# Patient Record
Sex: Male | Born: 1970 | Hispanic: No | Marital: Married | Smoking: Former smoker
Health system: Southern US, Community
[De-identification: ages and names within clinical notes are randomized; demographics above are authoritative.]

## PROBLEM LIST (undated history)

## (undated) DIAGNOSIS — M25569 Pain in unspecified knee: Secondary | ICD-10-CM

## (undated) DIAGNOSIS — R7303 Prediabetes: Secondary | ICD-10-CM

## (undated) DIAGNOSIS — I10 Essential (primary) hypertension: Secondary | ICD-10-CM

## (undated) DIAGNOSIS — E785 Hyperlipidemia, unspecified: Secondary | ICD-10-CM

## (undated) HISTORY — PX: HEMORROIDECTOMY: SUR656

## (undated) HISTORY — PX: LEG SURGERY: SHX1003

## (undated) HISTORY — DX: Hyperlipidemia, unspecified: E78.5

---

## 1998-08-01 ENCOUNTER — Emergency Department (HOSPITAL_COMMUNITY): Admission: EM | Admit: 1998-08-01 | Discharge: 1998-08-01 | Payer: Self-pay | Admitting: Internal Medicine

## 2001-04-07 ENCOUNTER — Emergency Department (HOSPITAL_COMMUNITY): Admission: EM | Admit: 2001-04-07 | Discharge: 2001-04-07 | Payer: Self-pay

## 2010-02-24 ENCOUNTER — Emergency Department (HOSPITAL_COMMUNITY): Admission: EM | Admit: 2010-02-24 | Discharge: 2010-02-24 | Payer: Self-pay | Admitting: Family Medicine

## 2013-03-14 ENCOUNTER — Ambulatory Visit: Payer: 59

## 2013-03-14 ENCOUNTER — Ambulatory Visit (INDEPENDENT_AMBULATORY_CARE_PROVIDER_SITE_OTHER): Payer: 59 | Admitting: Emergency Medicine

## 2013-03-14 VITALS — BP 132/88 | HR 70 | Temp 97.8°F | Resp 16 | Ht 66.0 in | Wt 211.0 lb

## 2013-03-14 DIAGNOSIS — M25511 Pain in right shoulder: Secondary | ICD-10-CM

## 2013-03-14 DIAGNOSIS — L0231 Cutaneous abscess of buttock: Secondary | ICD-10-CM

## 2013-03-14 DIAGNOSIS — E669 Obesity, unspecified: Secondary | ICD-10-CM

## 2013-03-14 DIAGNOSIS — L03317 Cellulitis of buttock: Secondary | ICD-10-CM

## 2013-03-14 DIAGNOSIS — M25519 Pain in unspecified shoulder: Secondary | ICD-10-CM

## 2013-03-14 DIAGNOSIS — K649 Unspecified hemorrhoids: Secondary | ICD-10-CM

## 2013-03-14 DIAGNOSIS — K602 Anal fissure, unspecified: Secondary | ICD-10-CM

## 2013-03-14 LAB — GLUCOSE, POCT (MANUAL RESULT ENTRY): POC Glucose: 91 mg/dl (ref 70–99)

## 2013-03-14 MED ORDER — LIDOCAINE (ANORECTAL) 5 % EX GEL: 1.0000 [drp] | Freq: Three times a day (TID) | CUTANEOUS | Status: DC

## 2013-03-14 MED ORDER — DILTIAZEM GEL 2 %: 1.0000 "application " | Freq: Three times a day (TID) | CUTANEOUS | Status: DC

## 2013-03-14 NOTE — Progress Notes (Signed)
  Subjective:    Patient ID: Michael [name], male    DOB: 1971-01-04, 42 y.o.   MRN: [id number]  HPI 42 year old male presents with:  1. Right shoulder pain x on and off for over a year; NKI; he works as a Scientist, forensic so constantly lifting heavy objects; at gym 1 month ago working out at [employer] and felt a lot of pain in right shoulder; No pain in neck, pain with internal and external rotation  2. hemorrhoids x 6 years; has seen 2 doctors concerning hemorrhoids.  Has not seen surgeon to remove.  Would like to see surgeon.  Intense pain 2 days ago; could not sit; wife saw 2 hemorrhoids.  Bleeding. Review of Systems     Objective:   Physical Exam patient has pain and limitation to internal rotation. He has normal rotator cuff strength. He has a popping sensation when he rotates the right shoulder. Examination of the anal area reveals ulcerated area on the inside of the right buttocks. There is inflammation around the anal area. When I do a rectal exam there is exquisite tenderness at 6:00. This would be consistent with a rectal fissure    [facility] reading (PRIMARY) by  Dr Cleta Alberts x-rays normal  Results for orders placed in visit on 03/14/13  GLUCOSE, POCT (MANUAL RESULT ENTRY)      Result Value Range   POC Glucose 91  70 - 99 mg/dl      Assessment & Plan:  Patient has a physical exam and history consistent with a anal fissure. I placed him on Cardizem and lidocaine to see if we can get this to heal. Appointment has been made with Gen. surgery to evaluate the area. He does not want to do any further evaluation of his shoulder at the present time but he will call when he is ready to see an orthopedist.

## 2013-03-18 LAB — HERPES SIMPLEX VIRUS CULTURE: Organism ID, Bacteria: NOT DETECTED

## 2013-03-22 ENCOUNTER — Ambulatory Visit (INDEPENDENT_AMBULATORY_CARE_PROVIDER_SITE_OTHER): Payer: 59 | Admitting: General Surgery

## 2013-03-22 ENCOUNTER — Telehealth (INDEPENDENT_AMBULATORY_CARE_PROVIDER_SITE_OTHER): Payer: Self-pay | Admitting: General Surgery

## 2013-03-22 NOTE — Telephone Encounter (Signed)
LMOM asking patient to return my call if he could come in sooner

## 2013-03-26 ENCOUNTER — Ambulatory Visit (INDEPENDENT_AMBULATORY_CARE_PROVIDER_SITE_OTHER): Payer: 59 | Admitting: Surgery

## 2013-05-21 ENCOUNTER — Encounter (INDEPENDENT_AMBULATORY_CARE_PROVIDER_SITE_OTHER): Payer: Self-pay | Admitting: Surgery

## 2013-05-21 ENCOUNTER — Ambulatory Visit (INDEPENDENT_AMBULATORY_CARE_PROVIDER_SITE_OTHER): Payer: 59 | Admitting: Surgery

## 2013-05-21 VITALS — BP 126/68 | HR 68 | Temp 97.9°F | Resp 14 | Ht 66.0 in | Wt 205.2 lb

## 2013-05-21 DIAGNOSIS — K645 Perianal venous thrombosis: Secondary | ICD-10-CM

## 2013-05-21 NOTE — Progress Notes (Signed)
[name] DOB: 1970/11/03 MRN: [id number]                                                                                      DATE: 05/21/2013  PCP: No PCP Per Patient Referring Provider: No ref. provider found  IMPRESSION:  Thrombosed left lateral external hemorrhoid  PLAN:   Discussed excision, and he is agreeable  Procedure Note: The thrombosed hemorrhoid is anesthetized with 1% xylo+Epi.The overlying skin is excised and the clot extracted. Gauze dresing applied. He wil continue sitz baths and recheck here is a week or two                  CC:  Chief Complaint  Patient presents with  . New Evaluation    eval throm hems    HPI:  Michael [name] is a 42 y.o.  male who presents for evaluation of a thrombose hemorrhoid. He has had issues with "hemorrhoids " for anbout seven years, occasionally protruding and giving him problems with BM's. Yesterday one became very tender and firm and he was started on some xylo creme with help and sent to the urgent office for evaluation  PMH:  has a past medical history of Hyperlipidemia.  PSH:   has no past surgical history on file.  ALLERGIES:  No Known Allergies  MEDICATIONS: Current outpatient prescriptions:ibuprofen (ADVIL,MOTRIN) 200 MG tablet, Take 200 mg by mouth every 6 (six) hours as needed for pain., Disp: , Rfl: ;  lidocaine (XYLOCAINE) 5 % ointment, , Disp: , Rfl: ;  Lidocaine, Anorectal, 5 % GEL, Apply 1 drop topically 3 (three) times daily., Disp: 30 g, Rfl: 2  ROS: He has filled out our 12 point review of systems and it is negative . EXAM:   VS: BP 126/68  Pulse 68  Temp(Src) 97.9 F (36.6 C) (Temporal)  Resp 14  Ht 5\' 6"  (1.676 m)  Wt 205 lb 3.2 oz (93.078 kg)  BMI 33.14 kg/m2 General: Alert, NAD Rectal: Tender thrombosed Left lateral hemorrhoid.      Symphany Fleissner J 05/21/2013  CC: No ref. provider found, No PCP Per Patient

## 2013-05-21 NOTE — Patient Instructions (Signed)
Hemorrhoids Hemorrhoids are swollen veins around the rectum or anus. There are two types of hemorrhoids:   Internal hemorrhoids. These occur in the veins just inside the rectum. They may poke through to the outside and become irritated and painful.  External hemorrhoids. These occur in the veins outside the anus and can be felt as a painful swelling or hard lump near the anus. CAUSES  Pregnancy.   Obesity.   Constipation or diarrhea.   Straining to have a bowel movement.   Sitting for long periods on the toilet.  Heavy lifting or other activity that caused you to strain.  Anal intercourse. SYMPTOMS   Pain.   Anal itching or irritation.   Rectal bleeding.   Fecal leakage.   Anal swelling.   One or more lumps around the anus.  DIAGNOSIS  Your caregiver may be able to diagnose hemorrhoids by visual examination. Other examinations or tests that may be performed include:   Examination of the rectal area with a gloved hand (digital rectal exam).   Examination of anal canal using a small tube (scope).   A blood test if you have lost a significant amount of blood.  A test to look inside the colon (sigmoidoscopy or colonoscopy). TREATMENT Most hemorrhoids can be treated at home. However, if symptoms do not seem to be getting better or if you have a lot of rectal bleeding, your caregiver may perform a procedure to help make the hemorrhoids get smaller or remove them completely. Possible treatments include:   Placing a rubber band at the base of the hemorrhoid to cut off the circulation (rubber band ligation).   Injecting a chemical to shrink the hemorrhoid (sclerotherapy).   Using a tool to burn the hemorrhoid (infrared light therapy).   Surgically removing the hemorrhoid (hemorrhoidectomy).   Stapling the hemorrhoid to block blood flow to the tissue (hemorrhoid stapling).  HOME CARE INSTRUCTIONS   Eat foods with fiber, such as whole grains, beans,  nuts, fruits, and vegetables. Ask your doctor about taking products with added fiber in them (fibersupplements).  Increase fluid intake. Drink enough water and fluids to keep your urine clear or pale yellow.   Exercise regularly.   Go to the bathroom when you have the urge to have a bowel movement. Do not wait.   Avoid straining to have bowel movements.   Keep the anal area dry and clean. Use wet toilet paper or moist towelettes after a bowel movement.   Medicated creams and suppositories may be used or applied as directed.   Only take over-the-counter or prescription medicines as directed by your caregiver.   Take warm sitz baths for 15 20 minutes, 3 4 times a day to ease pain and discomfort.   Place ice packs on the hemorrhoids if they are tender and swollen. Using ice packs between sitz baths may be helpful.   Put ice in a plastic bag.   Place a towel between your skin and the bag.   Leave the ice on for 15 20 minutes, 3 4 times a day.   Do not use a donut-shaped pillow or sit on the toilet for long periods. This increases blood pooling and pain.  SEEK MEDICAL CARE IF:  You have increasing pain and swelling that is not controlled by treatment or medicine.  You have uncontrolled bleeding.  You have difficulty or you are unable to have a bowel movement.  You have pain or inflammation outside the area of the hemorrhoids. MAKE SURE YOU:    Understand these instructions.  Will watch your condition.  Will get help right away if you are not doing well or get worse. Document Released: 09/30/2000 Document Revised: 09/19/2012 Document Reviewed: 08/07/2012 [employer] Patient Information 2014 [employer].  

## 2013-06-05 ENCOUNTER — Ambulatory Visit
Admission: RE | Admit: 2013-06-05 | Discharge: 2013-06-05 | Disposition: A | Discharge status: Home / Self Care | Payer: 59 | Source: Ambulatory Visit | Attending: General Surgery | Admitting: General Surgery

## 2013-06-05 ENCOUNTER — Ambulatory Visit (INDEPENDENT_AMBULATORY_CARE_PROVIDER_SITE_OTHER): Payer: 59 | Admitting: General Surgery

## 2013-06-05 VITALS — BP 118/82 | HR 90 | Temp 100.6°F | Resp 18 | Ht 66.0 in | Wt 209.4 lb

## 2013-06-05 DIAGNOSIS — K6289 Other specified diseases of anus and rectum: Secondary | ICD-10-CM

## 2013-06-05 DIAGNOSIS — R300 Dysuria: Secondary | ICD-10-CM

## 2013-06-05 DIAGNOSIS — R309 Painful micturition, unspecified: Secondary | ICD-10-CM

## 2013-06-05 MED ORDER — IOHEXOL 300 MG/ML  SOLN
125.0000 mL | Freq: Once | INTRAMUSCULAR | Status: AC | PRN
  Administered 2013-06-05: 125 mL via INTRAVENOUS

## 2013-06-05 MED ORDER — SULFAMETHOXAZOLE-TRIMETHOPRIM 800-160 MG PO TABS: 2.0000 | ORAL_TABLET | Freq: Two times a day (BID) | ORAL | Status: DC

## 2013-06-05 NOTE — Progress Notes (Addendum)
Chief Complaint  Patient presents with  . Follow-up    HISTORY: Michael [name] is a 42 y.o. male who presents to the office with anal pain.  He is s/p excision of a thrombosed hemorrhoid 2 weeks ago.  Other symptoms include pain relief with urination.  He has had several episodes like this over the past few years.  He has tried dietary changes in the past with no success.  He denies sharp pain with BM's.     His bowel habits are regular and frequent and his bowel movements are soft.  his fiber intake is dietary.     Past Medical History  Diagnosis Date  . Hyperlipidemia       No past surgical history on file.      Current Outpatient Prescriptions  Medication Sig Dispense Refill  . ibuprofen (ADVIL,MOTRIN) 200 MG tablet Take 200 mg by mouth every 6 (six) hours as needed for pain.      Marland Kitchen lidocaine (XYLOCAINE) 5 % ointment       . Lidocaine, Anorectal, 5 % GEL Apply 1 drop topically 3 (three) times daily.  30 g  2  . sulfamethoxazole-trimethoprim (BACTRIM DS,SEPTRA DS) 800-160 MG per tablet Take 2 tablets by mouth 2 (two) times daily.  28 tablet  2   No current facility-administered medications for this visit.      No Known Allergies    Family History  Problem Relation Age of Onset  . Diabetes Mother   . Diabetes Father     History   Social History  . Marital Status: Married    Spouse Name: N/A    Number of Children: N/A  . Years of Education: N/A   Social History Main Topics  . Smoking status: Former Smoker    Quit date: 09/28/2011  . Smokeless tobacco: Never Used  . Alcohol Use: 12.0 oz/week    20 Cans of beer per week  . Drug Use: No  . Sexual Activity: Not on file   Other Topics Concern  . Not on file   Social History Narrative  . No narrative on file      REVIEW OF SYSTEMS - PERTINENT POSITIVES ONLY: Review of Systems - General ROS: negative for - chills, fever or weight loss Hematological and Lymphatic ROS: negative for - bleeding problems, blood clots  or bruising Respiratory ROS: no cough, shortness of breath, or wheezing Cardiovascular ROS: no chest pain or dyspnea on exertion Gastrointestinal ROS: no abdominal pain, change in bowel habits, or black or bloody stools Genito-Urinary ROS: no dysuria, trouble voiding, or hematuria  EXAM: Filed Vitals:   06/05/13 1452  BP: 118/82  Pulse: 90  Temp: 100.6 F (38.1 C)  Resp: 18    General appearance: alert and cooperative GI: soft, non-tender; bowel sounds normal; no masses,  no organomegaly Anal Exam Findings: thrombectomy site healing well, his pain is associated with a anterior mass, no masses noted on DRE, no posterior fissure, unable to tolerate anoscopy Aspiration of anterior mass attempted.  Verbal consent obtained.  Area prepped and infused with lidocaine.  Aspirated with a larger needle.  No return of purulent fluid noted.      ASSESSMENT AND PLAN: Michael [name] is a 42 y.o. M with anterior perianal pain.  This gets better temporarily with urination.  He is also running a low grade fever.  I suspect an infectious process, but I cannot identify anything concrete on my exam.  I will get a CT to  rule out any deep fluid collections.  I will also get a UA to evaluate for UTI.  I will then treat empirically for prostatitis with bactrim.  I encouraged him to follow up with a PCP for his general care.  I will have him f/u PRN if his tests are negative.  I will be glad to examine his internal hemorrhoids in the future, once this other pain is addressed.     Addendum: CT reviewed with a possible small fluid collection at the base of the penis.  I am going to treat for possible prostatitis.  I have recommended that he see his PCP if the pain does not resolve with a course of bactrim.  UA was neg  Vanita Panda, MD Colon and Rectal Surgery / General Surgery [facility]      Visit Diagnoses: 1. Urination pain   2. Anal pain     Primary Care Physician: No PCP  Per Patient

## 2013-06-05 NOTE — Patient Instructions (Signed)

## 2013-06-06 ENCOUNTER — Telehealth (INDEPENDENT_AMBULATORY_CARE_PROVIDER_SITE_OTHER): Payer: Self-pay

## 2013-06-06 LAB — URINALYSIS, ROUTINE W REFLEX MICROSCOPIC
Hgb urine dipstick: NEGATIVE
Ketones, ur: NEGATIVE mg/dL
Leukocytes, UA: NEGATIVE
Nitrite: NEGATIVE
Urobilinogen, UA: 0.2 mg/dL (ref 0.0–1.0)
pH: 5.5 (ref 5.0–8.0)

## 2013-06-06 NOTE — Telephone Encounter (Signed)
Message copied by Ivory Broad on Thu Jun 06, 2013  9:31 AM ------      Message from: [place], Broadus John.      Created: Thu Jun 06, 2013  8:27 AM       Please let him know that his UA was negative, and his CT showed a possible small fluid collection at the base of his penis.  He should continue complete his antibiotic course.  If he is still having trouble after the antibiotics are complete, he should contact his PCP for possible referral to a urologist.  If he would like me to look at his internal hemorrhoids at some point in the future, I would be glad to do this once his pain is better controlled.                  AT      ----- Message -----         From: Rad Results In Interface         Sent: 06/05/2013   4:55 PM           To: Romie Levee, MD                   ------

## 2013-06-06 NOTE — Telephone Encounter (Signed)
I called and notified the pt of the results below.  I told him to complete the antibiotics.  He states he still has a lot of pain.  I advised to also try soaking in a warm tub for 20 minutes about 3 times a day.  That should help the pain and the antibiotics should help him get better.  I told him if no better after completes them, call his medical md.  I told him he can come back at a later date to have hemorrhoids checked.

## 2013-08-20 ENCOUNTER — Encounter (HOSPITAL_COMMUNITY): Payer: Self-pay | Admitting: Emergency Medicine

## 2013-08-20 ENCOUNTER — Emergency Department (HOSPITAL_COMMUNITY): Payer: 59

## 2013-08-20 ENCOUNTER — Emergency Department (HOSPITAL_COMMUNITY)
Admission: EM | Admit: 2013-08-20 | Discharge: 2013-08-21 | Disposition: A | Discharge status: Home / Self Care | Payer: 59 | Attending: Emergency Medicine | Admitting: Emergency Medicine

## 2013-08-20 DIAGNOSIS — Z862 Personal history of diseases of the blood and blood-forming organs and certain disorders involving the immune mechanism: Secondary | ICD-10-CM | POA: Insufficient documentation

## 2013-08-20 DIAGNOSIS — Z87891 Personal history of nicotine dependence: Secondary | ICD-10-CM | POA: Insufficient documentation

## 2013-08-20 DIAGNOSIS — Z8639 Personal history of other endocrine, nutritional and metabolic disease: Secondary | ICD-10-CM | POA: Insufficient documentation

## 2013-08-20 DIAGNOSIS — K644 Residual hemorrhoidal skin tags: Secondary | ICD-10-CM | POA: Insufficient documentation

## 2013-08-20 LAB — CBC WITH DIFFERENTIAL/PLATELET
Basophils Absolute: 0.1 10*3/uL (ref 0.0–0.1)
Basophils Relative: 1 % (ref 0–1)
HCT: 44.4 % (ref 39.0–52.0)
Lymphocytes Relative: 30 % (ref 12–46)
MCHC: 35.6 g/dL (ref 30.0–36.0)
Monocytes Absolute: 0.6 10*3/uL (ref 0.1–1.0)
Neutro Abs: 4.3 10*3/uL (ref 1.7–7.7)
Neutrophils Relative %: 57 % (ref 43–77)
RDW: 12.2 % (ref 11.5–15.5)
WBC: 7.6 10*3/uL (ref 4.0–10.5)

## 2013-08-20 LAB — BASIC METABOLIC PANEL
Chloride: 104 mEq/L (ref 96–112)
Creatinine, Ser: 0.93 mg/dL (ref 0.50–1.35)
GFR calc Af Amer: >90 mL/min (ref >90)
GFR calc non Af Amer: >90 mL/min (ref >90)
Potassium: 4.9 mEq/L (ref 3.5–5.1)

## 2013-08-20 MED ORDER — MORPHINE SULFATE 4 MG/ML IJ SOLN
4.0000 mg | Freq: Once | INTRAMUSCULAR | Status: AC
  Administered 2013-08-20: 4 mg via INTRAVENOUS
  Filled 2013-08-20: qty 1

## 2013-08-20 MED ORDER — SODIUM CHLORIDE 0.9 % IV BOLUS (SEPSIS)
1000.0000 mL | Freq: Once | INTRAVENOUS | Status: AC
  Administered 2013-08-20: 1000 mL via INTRAVENOUS

## 2013-08-20 MED ORDER — IOHEXOL 300 MG/ML  SOLN
100.0000 mL | Freq: Once | INTRAMUSCULAR | Status: AC | PRN
  Administered 2013-08-20: 100 mL via INTRAVENOUS

## 2013-08-20 NOTE — ED Notes (Signed)
Patient presents today with a chief complaint of rectal pain from current hemorrhoids. Patient is seeking treatment for his hemorrhoids but reports today his rectum is inflamed and the pain has worsened.  Patient also reporting heavy blood when wiping.

## 2013-08-20 NOTE — ED Provider Notes (Addendum)
CSN: [id number]     Arrival date & time 08/20/13  1823 History   First MD Initiated Contact with Patient 08/20/13 2002     Chief Complaint  Patient presents with  . Rectal Pain   (Consider location/radiation/quality/duration/timing/severity/associated sxs/prior Treatment) HPI Comments: 42 y.o. male who presents to the office with anal pain.  He is s/p excision of a thrombosed hemorrhoid in July. Pt states that his current pain has been actually going on for few days, and got worse today. He has been having bloody stools throughout - no mucus or pus appreciated. Pt denies any n/v/f/c. Pt has been applying some topical hemorrhoidal cream with minimal relief at this time.  The history is provided by the patient and medical records.    Past Medical History  Diagnosis Date  . Hyperlipidemia    History reviewed. No pertinent past surgical history. Family History  Problem Relation Age of Onset  . Diabetes Mother   . Diabetes Father    History  Substance Use Topics  . Smoking status: Former Smoker    Quit date: 09/28/2011  . Smokeless tobacco: Never Used  . Alcohol Use: 12.0 oz/week    20 Cans of beer per week    Review of Systems  Constitutional: Negative for activity change and appetite change.  Respiratory: Negative for cough and shortness of breath.   Cardiovascular: Negative for chest pain.  Gastrointestinal: Positive for blood in stool, anal bleeding and rectal pain. Negative for abdominal pain.  Genitourinary: Negative for dysuria.    Allergies  Review of patient's allergies indicates no known allergies.  Home Medications   Current Outpatient Rx  Name  Route  Sig  Dispense  Refill  . ibuprofen (ADVIL,MOTRIN) 200 MG tablet   Oral   Take 200 mg by mouth every 6 (six) hours as needed for pain.          BP 133/82  Pulse 71  Temp(Src) 99 F (37.2 C) (Oral)  Resp 20  SpO2 97% Physical Exam  Nursing note and vitals reviewed. Constitutional: He is oriented to  person, place, and time. He appears well-developed.  HENT:  Head: Normocephalic and atraumatic.  Eyes: Conjunctivae and EOM are normal. Pupils are equal, round, and reactive to light.  Neck: Normal range of motion. Neck supple.  Cardiovascular: Normal rate and regular rhythm.   Pulmonary/Chest: Effort normal and breath sounds normal.  Abdominal: Soft. Bowel sounds are normal. He exhibits no distension. There is no tenderness. There is no rebound and no guarding.  Pt has diffuse perirectal induration and erythema.  No large visible hemorrhoid. Pts DRE does reveals some rectal lesion - ? Fluctuance, extremely tender.  Neurological: He is alert and oriented to person, place, and time.  Skin: Skin is warm.    ED Course  Procedures (including critical care time) Labs Review Labs Reviewed  BASIC METABOLIC PANEL  CBC WITH DIFFERENTIAL   Imaging Review No results found.  EKG Interpretation   None       MDM  No diagnosis found.  Pt with hx of hemorrhoids comes in with rectal pain and bloody stools. Rectal exam was tender. DDX would include proctitis vs. hemorrhoids and it's infections complications - including abscess/fistula.  We will get CT scan of the pelvis, to ensure there is no abscess. Given the hx, likely not proctitis.   Derwood Kaplan, MD 08/20/13 2124  12:49 AM CT pelvis is neg for absces.s Will d.c and have him see the [facility] rectal team.  Derwood Kaplan, MD 08/21/13 [phone]

## 2013-08-21 MED ORDER — OXYCODONE-ACETAMINOPHEN 5-325 MG PO TABS
2.0000 | ORAL_TABLET | Freq: Once | ORAL | Status: AC
  Administered 2013-08-21: 2 via ORAL
  Filled 2013-08-21: qty 2

## 2013-08-21 MED ORDER — OXYCODONE-ACETAMINOPHEN 5-325 MG PO TABS: 1.0000 | ORAL_TABLET | ORAL | Status: DC | PRN

## 2013-08-21 MED ORDER — HYDROCORTISONE 1 % EX CREA: TOPICAL_CREAM | CUTANEOUS | Status: DC

## 2013-08-21 MED ORDER — LIDOCAINE 5 % EX OINT: 1.0000 "application " | TOPICAL_OINTMENT | CUTANEOUS | Status: DC | PRN

## 2013-08-21 MED ORDER — DOCUSATE SODIUM 100 MG PO CAPS: 100.0000 mg | ORAL_CAPSULE | Freq: Two times a day (BID) | ORAL | Status: DC

## 2013-12-11 ENCOUNTER — Other Ambulatory Visit: Payer: Self-pay | Admitting: Specialist

## 2013-12-11 DIAGNOSIS — N50819 Testicular pain, unspecified: Secondary | ICD-10-CM

## 2013-12-12 ENCOUNTER — Ambulatory Visit
Admission: RE | Admit: 2013-12-12 | Discharge: 2013-12-12 | Disposition: A | Discharge status: Home / Self Care | Payer: No Typology Code available for payment source | Source: Ambulatory Visit | Attending: Specialist | Admitting: Specialist

## 2013-12-12 DIAGNOSIS — N50819 Testicular pain, unspecified: Secondary | ICD-10-CM

## 2014-12-02 IMAGING — CT CT PELVIS W/ CM
2 of 3 series · 17 of 46 positions shown, 19 images · IV contrast (omnipaque)
Comparison: CT of the pelvis June 05, 2013.

ADDENDUM:
The left iliac lesion would be better characterized on MRI of the
pelvis with contrast as clinically indicated.

  By: [REDACTED]AL DATA:  Hemorrhoid pain, evaluate for perirectal fistula or
abscess.
EXAM:
CT PELVIS WITH CONTRAST
TECHNIQUE: Multidetector CT imaging of the pelvis was performed using the
standard protocol following the bolus administration of intravenous
contrast.
CONTRAST:  100mL OMNIPAQUE IOHEXOL 300 MG/ML  SOLN

[Series 3: pelvis 5.0 b31f · axial · 0.68mm/px · z∈[+762,+1008]mm · 14 of 57 slices shown, 16 images]
[im 4/57  soft-tissue]
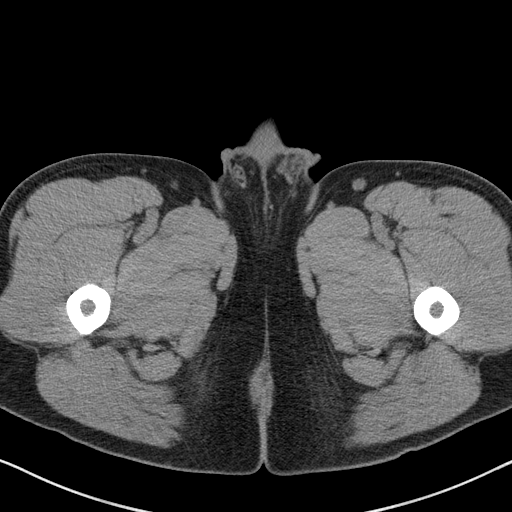
[im 4/57  bone]
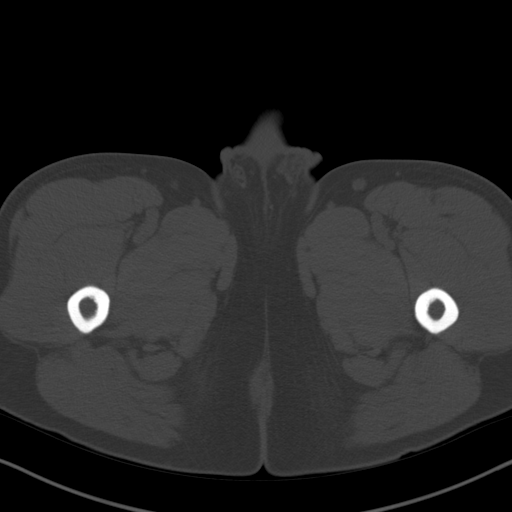
[im 8/57  soft-tissue]
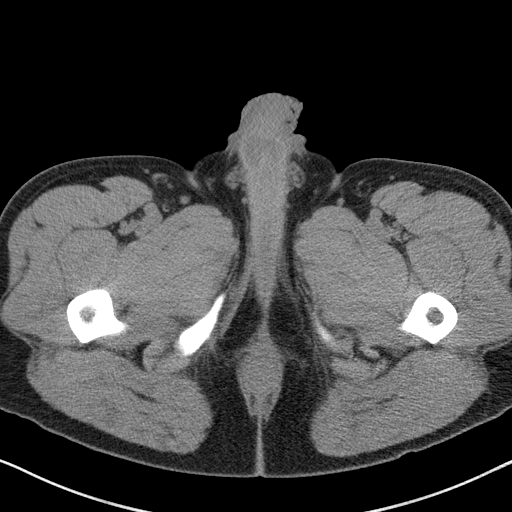
[im 11/57  soft-tissue]
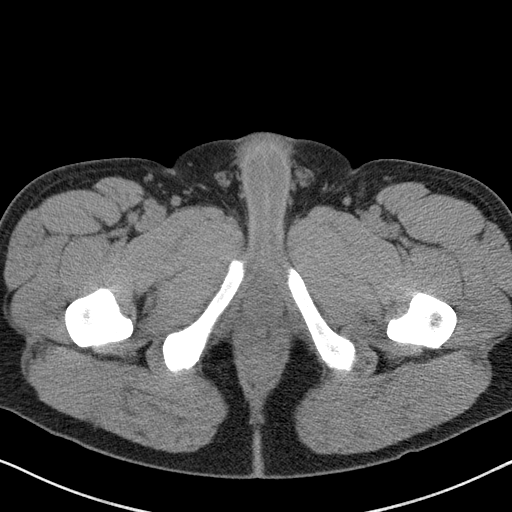
[im 15/57  soft-tissue]
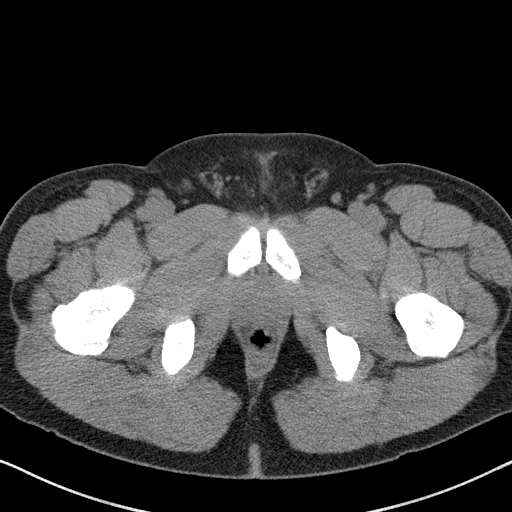
[im 19/57  soft-tissue]
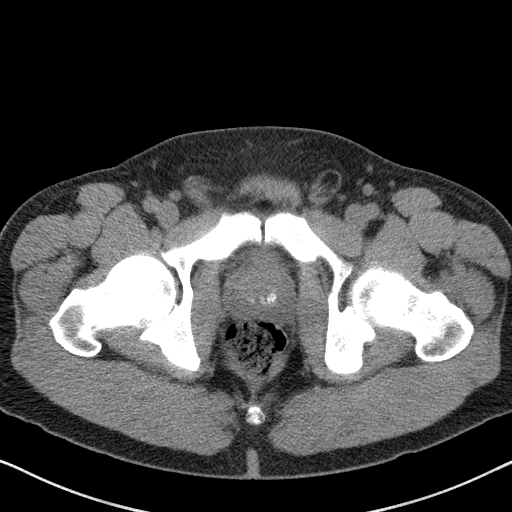
[im 22/57  soft-tissue]
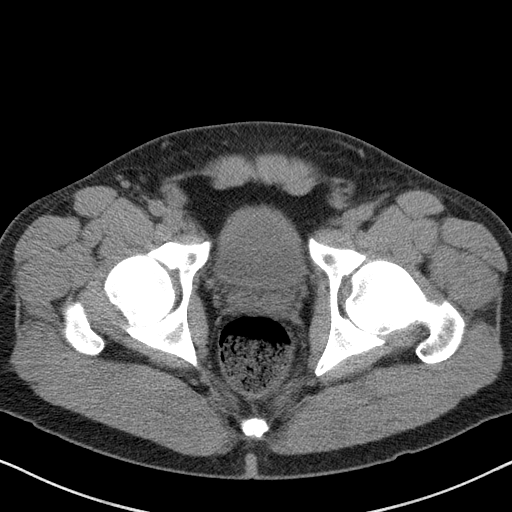
[im 26/57  soft-tissue]
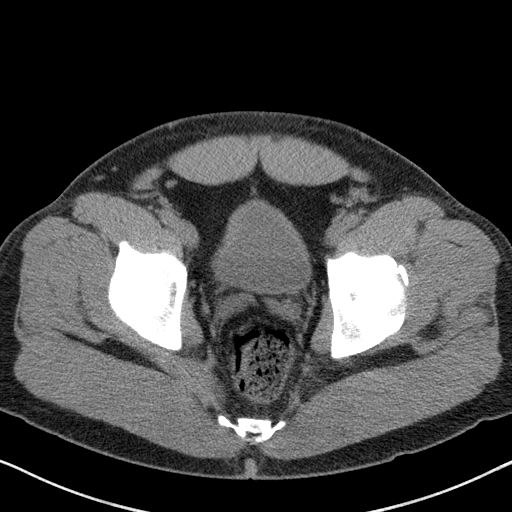
[im 31/57  soft-tissue]
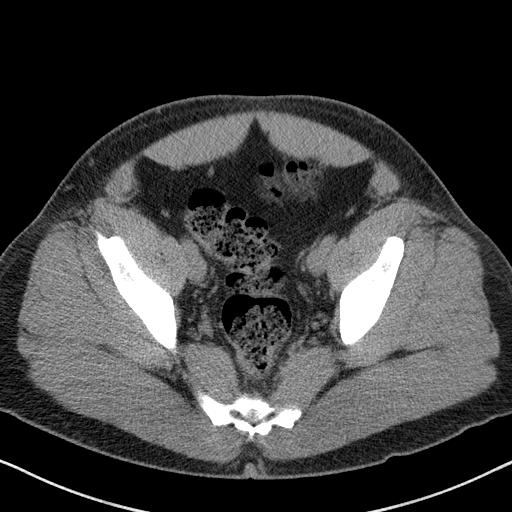
[im 35/57  soft-tissue]
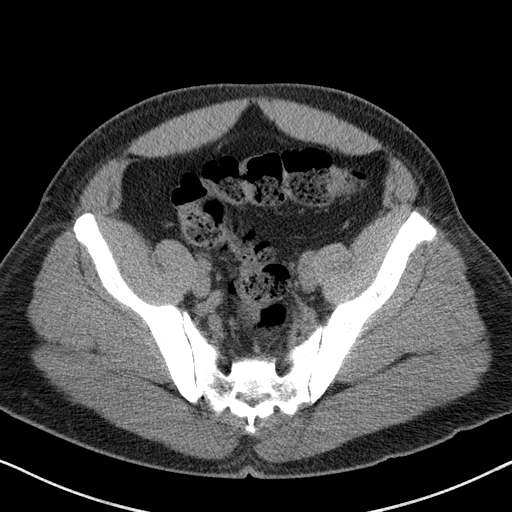
[im 35/57  bone]
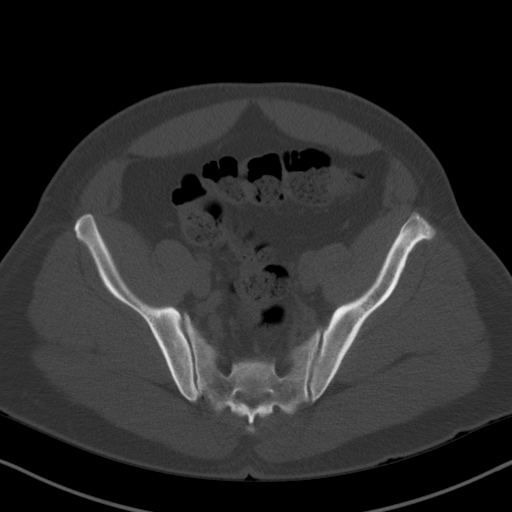
[im 38/57  soft-tissue]
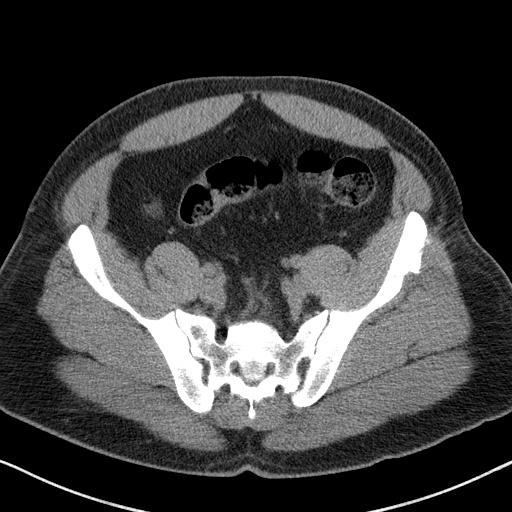
[im 42/57  soft-tissue]
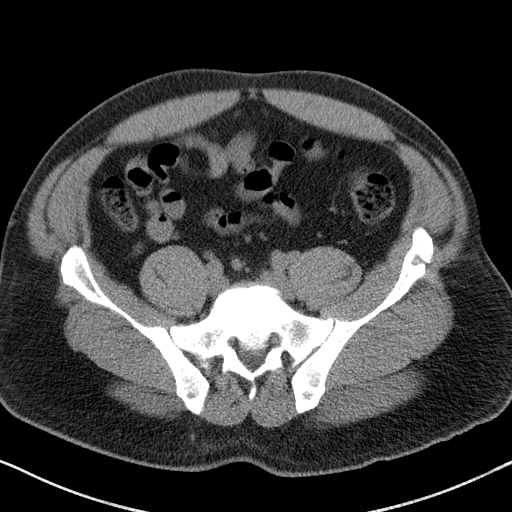
[im 46/57  soft-tissue]
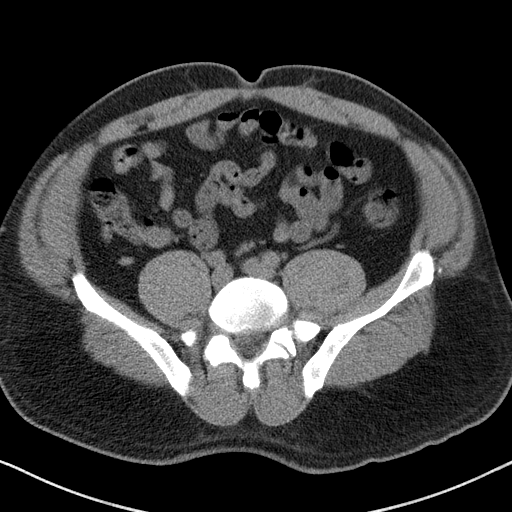
[im 49/57  soft-tissue]
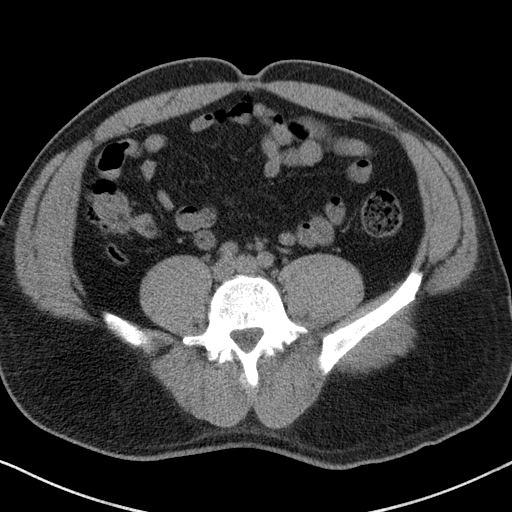
[im 53/57  soft-tissue]
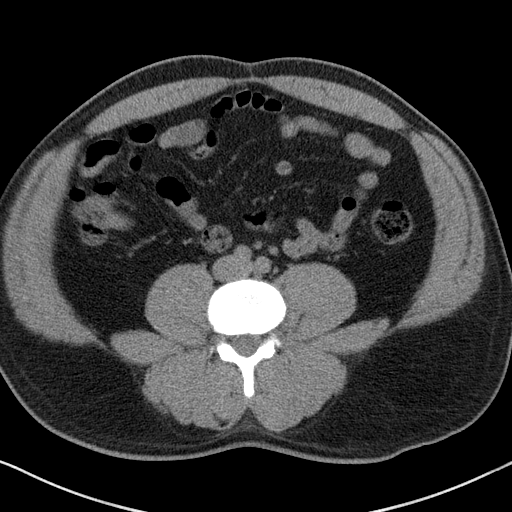

[Series 602: <mpr thick range> · coronal · 0.68mm/px · 3 of 80 slices shown]
[im 27/80  soft-tissue]
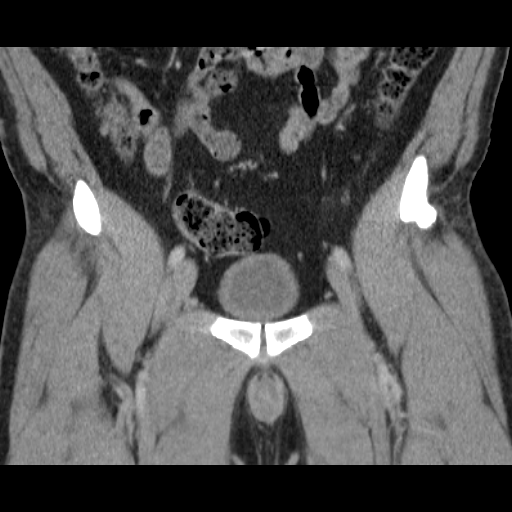
[im 36/80  soft-tissue]
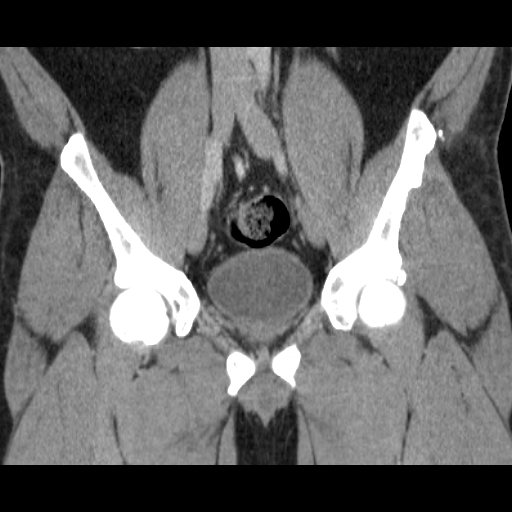
[im 44/80  soft-tissue]
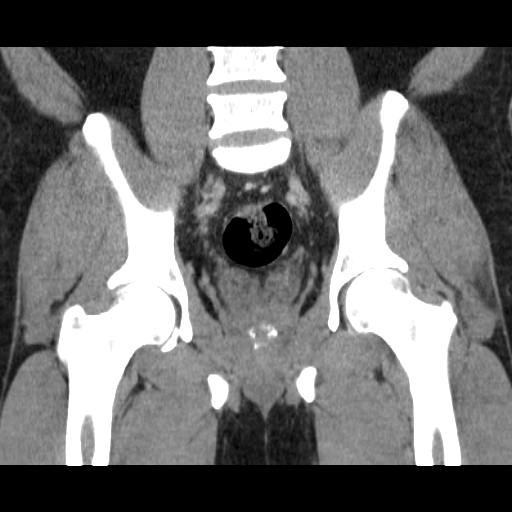

[17 of 46 positions shown; findings below may reference images not displayed]

FINDINGS: Moderate amount of retained large bowel stool in the included view
of the colon. Normal air-filled appendix. No perirectal inflammatory
changes, free fluid or focal fluid collections. Focal low density
seen within the anal region on prior examination is not seen today.
Preservation of the ischiorectal fat.

Great vessels are normal in course and caliber. Urinary bladder is
partially distended and unremarkable. Prostate is 4.8 cm in
transaxial dimension with dystrophic calcifications. Small inguinal
lymph nodes are unchanged. Bony excrescence from the left anterior
inferior iliac spine, with central lucency and possible sequestrum
which may reflect old trauma/infection or post procedural change,
similar in appearance to June 05, 2013.
IMPRESSION: No CT findings of fistula or abscess. Resolution of low-density
possible fluid collection seen on prior CT. If ongoing concern for
fistula, enteric contrast may be of added value.

By: Ranjani Nickerson

## 2015-02-03 ENCOUNTER — Encounter: Payer: Self-pay | Admitting: Physician Assistant

## 2015-02-17 ENCOUNTER — Ambulatory Visit: Payer: Self-pay | Admitting: Physician Assistant

## 2015-03-26 IMAGING — US US SCROTUM
1 series · 13 of 25 positions shown · non-contrast
Comparison: CT PELVIS W/CM dated 08/20/2013; CT PELVIS W/CM dated
06/05/2013

CLINICAL DATA: 0

EXAM:
SCROTAL ULTRASOUND
DOPPLER ULTRASOUND OF THE TESTICLES
TECHNIQUE: Complete ultrasound examination of the testicles, epididymis, and
other scrotal structures was performed. Color and spectral Doppler
ultrasound were also utilized to evaluate blood flow to the
testicles.

[Series 1: us scrotum · 0.10mm/px · 13 of 92 slices shown]
[im 1/92]
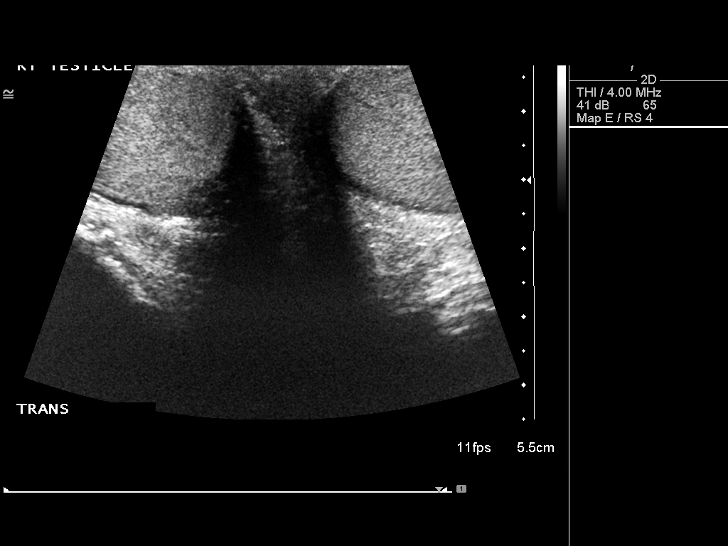
[im 8/92]
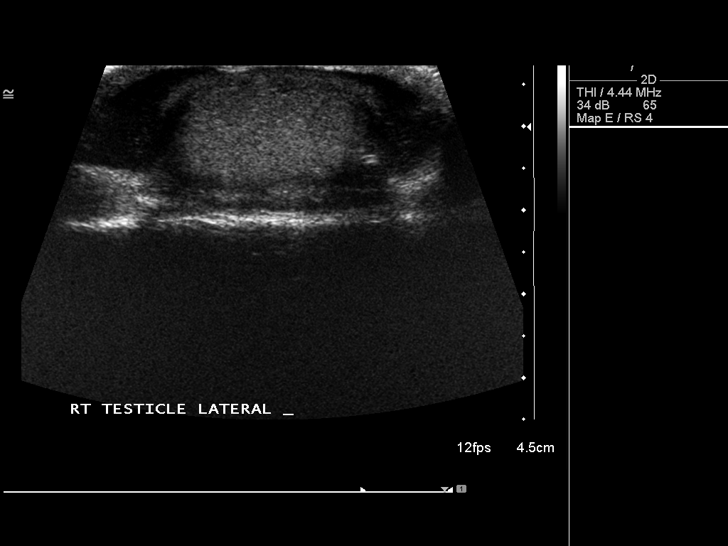
[im 16/92]
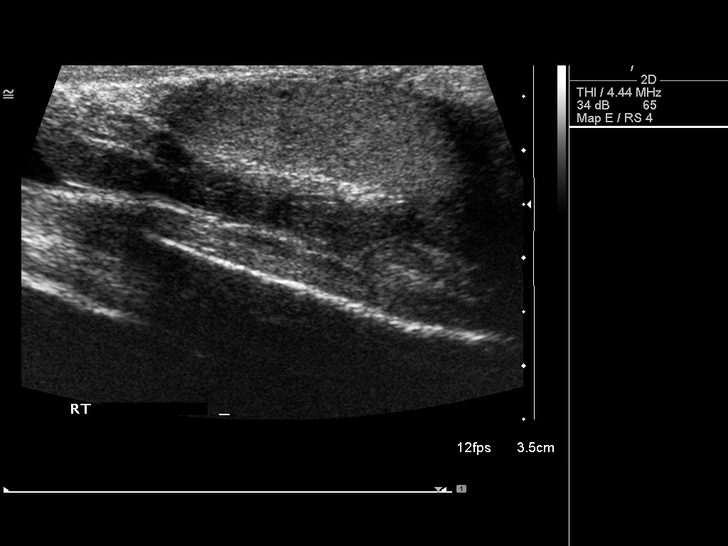
[im 23/92]
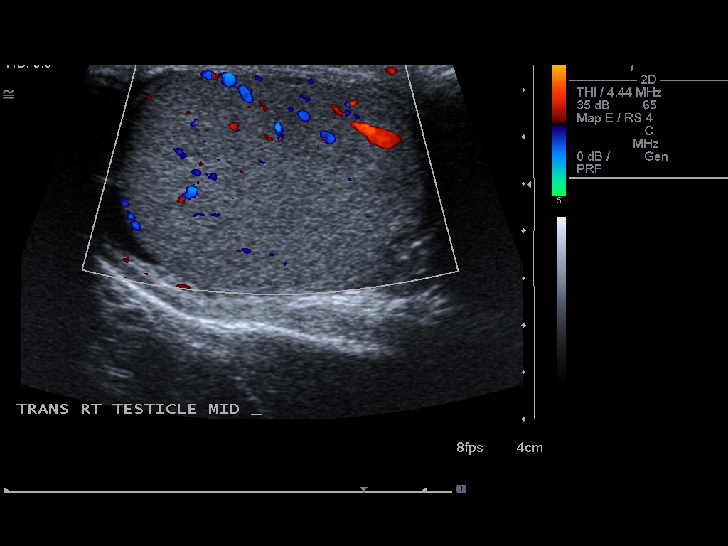
[im 31/92]
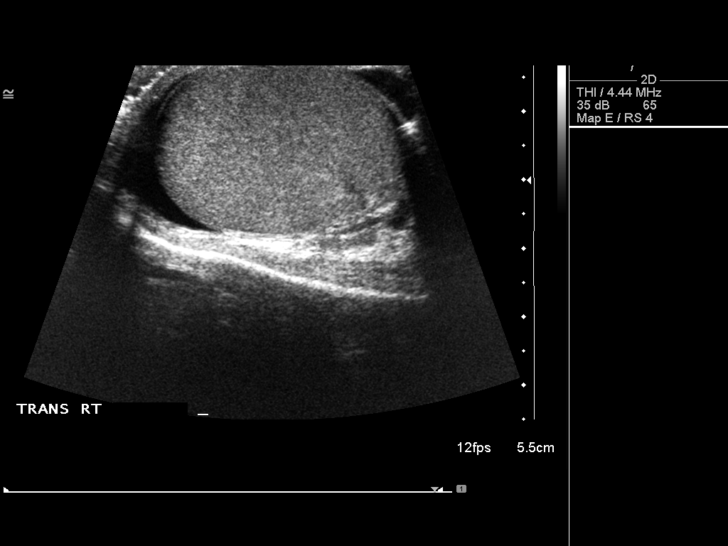
[im 38/92]
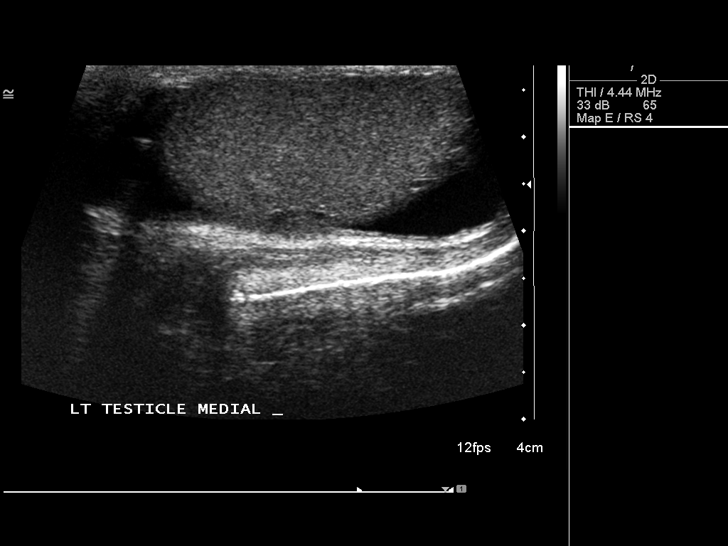
[im 46/92]
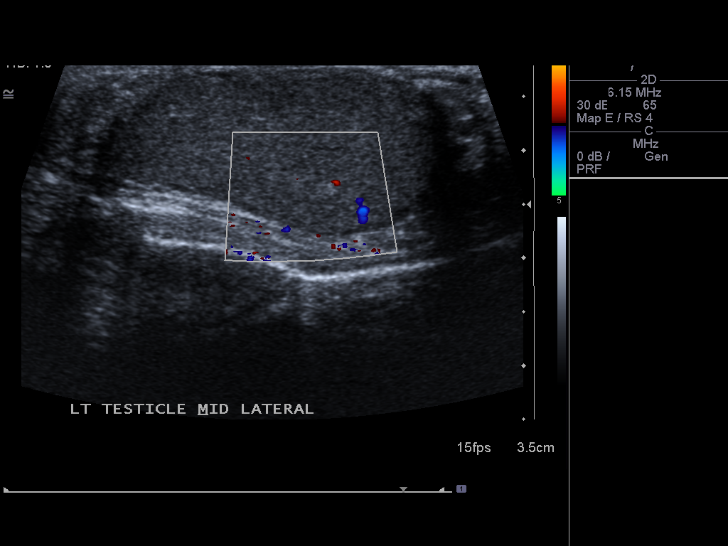
[im 54/92]
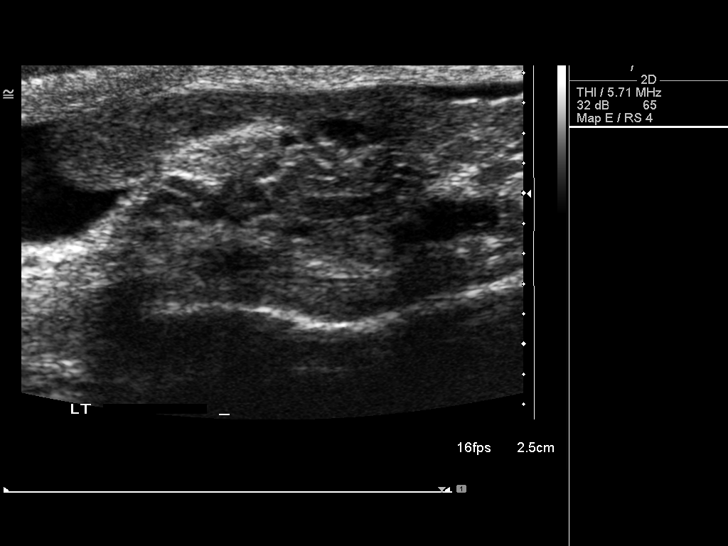
[im 61/92]
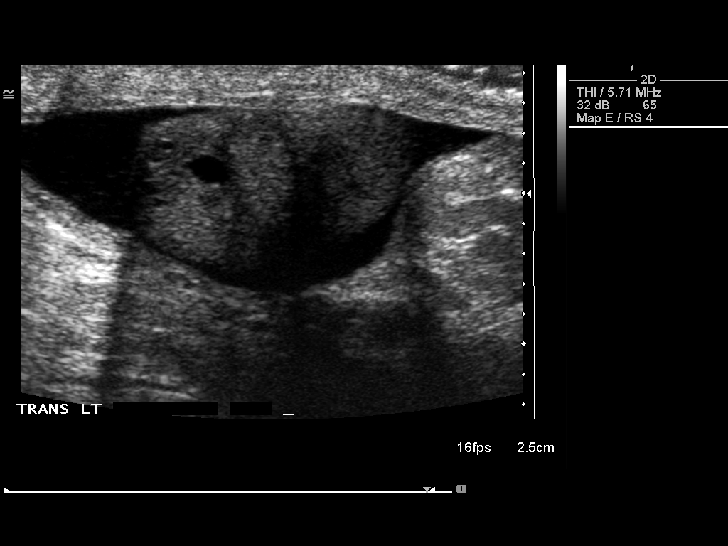
[im 69/92]
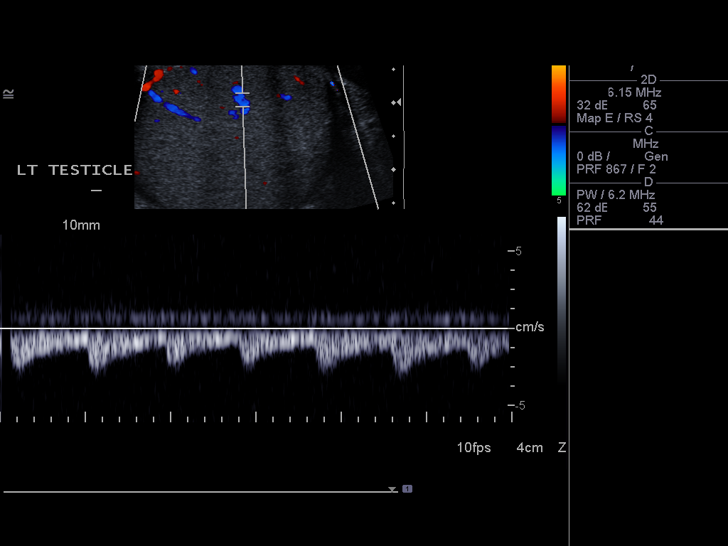
[im 76/92]
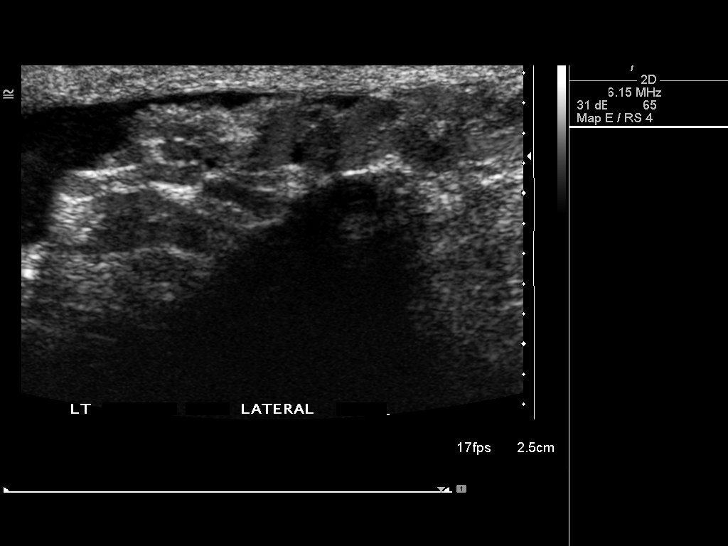
[im 84/92]
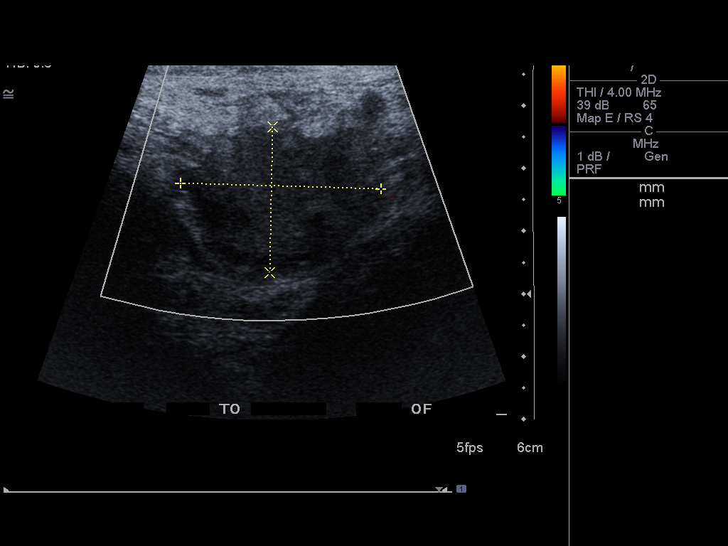
[im 92/92]
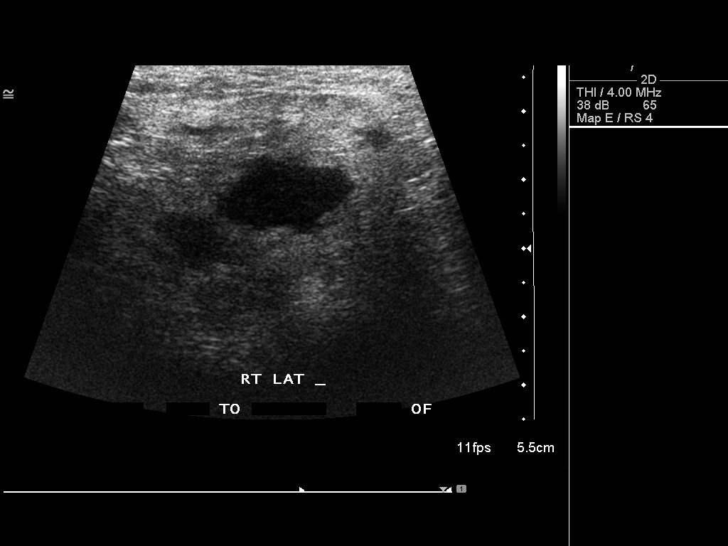

[13 of 25 positions shown; findings below may reference images not displayed]

FINDINGS: Right testicle

Measurements: 4.8 x 2.5 x 3.3 cm.. No mass or microlithiasis
visualized.

Left testicle

Measurements: 4.5 x 2.5 x 3.3 cm. No testicular mass is
demonstrated. Two hyperechoic fociwithin the left testicle measuring
2 mm in diameter are demonstrated.

Right epididymis: Normal in size. A 2 mm diameter cyst is present in
the head of the right epididymis

Left epididymis: Normal in size. A 3 mm diameter cyst is present in
the head of the left epididymis.

Hydrocele:  There small bilateral hydroceles.

Varicocele:  There is a varicocele on the left.

Pulsed Doppler interrogation of both testes demonstrates low
resistance arterial and venous waveforms bilaterally.

The patient indicates that tenderness lies in the retro scrotal
region anterior to the anus in the perineal soft tissues. There are
a complex appearing hypoechoic focus is demonstrated measuring 3.2 x
2.5 x 2.9 cm. This structure does not appear hypervascular. This is
at the site of previous CT abnormality on June 05, 2013.
IMPRESSION: 1. The testes and epididymal structures exhibit no acute
abnormalities. Vascularity appears normal.
2. There small bilateral hydroceles. A small varicocele on the left
is present.
3. There is a complex appearing structure anterior to the anus and
posterior to the base of the scrotum which may reflect an abscess.
This is in an area of previous abnormality described on a CT scan June 05, 2013.

## 2018-04-17 ENCOUNTER — Ambulatory Visit (HOSPITAL_COMMUNITY)
Admission: EM | Admit: 2018-04-17 | Discharge: 2018-04-17 | Disposition: A | Discharge status: Home / Self Care | Payer: Self-pay | Attending: Family Medicine | Admitting: Family Medicine

## 2018-04-17 ENCOUNTER — Other Ambulatory Visit: Payer: Self-pay

## 2018-04-17 ENCOUNTER — Encounter (HOSPITAL_COMMUNITY): Payer: Self-pay | Admitting: Emergency Medicine

## 2018-04-17 DIAGNOSIS — M722 Plantar fascial fibromatosis: Secondary | ICD-10-CM

## 2018-04-17 MED ORDER — NAPROXEN 500 MG PO TABS: 500.0000 mg | ORAL_TABLET | Freq: Two times a day (BID) | ORAL | 0 refills | Status: DC

## 2018-04-17 NOTE — ED Triage Notes (Addendum)
[facility] a [facility] of right foot pain that is worse in the morning.The patient denied any known injury.

## 2018-04-17 NOTE — Discharge Instructions (Addendum)
Rest Ice Roll heel on golf ball in am Please be sure to wear shoes that fit and are not worn out Please try stretching calf, foot ankle circles, toe curls Use anti-inflammatories for pain/swelling. You may take up to 800 mg Ibuprofen every 8 hours with food. You may supplement Ibuprofen with Tylenol [phone] mg every 8 hours Or you may try naprosyn sent in   Please return if pain worsening or not improving, may follow up with orthopedics

## 2018-04-17 NOTE — ED Provider Notes (Signed)
[facility]    CSN: [id number] Arrival date & time: 04/17/18  1053     History   Chief Complaint Chief Complaint  Patient presents with  . Foot Pain    HPI Michael [name] is a 47 y.o. male history hyperlipidemia presenting evaluation right heel pain.  Patient states that for the past 3 weeks he has had pain in the bottom of his foot.  Pain is worse in the morning, improves as he walks more on his foot.  He has been taking ibuprofen which has helped some.  His wife is also been giving him massages at night to this area.  Denies any injury.  Denies ankle pain.  Denies pain at the top of his foot.  States he likes to wear his sandals frequently.  HPI  Past Medical History:  Diagnosis Date  . Hyperlipidemia     Patient Active Problem List   Diagnosis Date Noted  . Hemorrhoids, external, thrombosed 05/21/2013    History reviewed. No pertinent surgical history.     Home Medications    Prior to Admission medications   Medication Sig Start Date End Date Taking? Authorizing Provider  naproxen (NAPROSYN) 500 MG tablet Take 1 tablet (500 mg total) by mouth 2 (two) times daily. 04/17/18   Jayko Voorhees, Junius CreamerHallie C, PA-C    Family History Family History  Problem Relation Age of Onset  . Diabetes Mother   . Diabetes Father     Social History Social History   Tobacco Use  . Smoking status: Former Smoker    Last attempt to quit: 09/28/2011    Years since quitting: 6.5  . Smokeless tobacco: Never Used  Substance Use Topics  . Alcohol use: Yes    Alcohol/week: 12.0 oz    Types: 20 Cans of beer per week  . Drug use: No     Allergies   Patient has no known allergies.   Review of Systems Review of Systems  Constitutional: Negative for activity change, chills, diaphoresis and fatigue.  Eyes: Negative for visual disturbance.  Respiratory: Negative for chest tightness and shortness of breath.   Cardiovascular: Negative for chest pain and leg swelling.    Gastrointestinal: Negative for abdominal pain, nausea and vomiting.  Musculoskeletal: Positive for gait problem and myalgias. Negative for arthralgias, back pain, neck pain and neck stiffness.  Skin: Negative for color change and wound.  Neurological: Negative for dizziness, weakness, light-headedness, numbness and headaches.     Physical Exam Triage Vital Signs ED Triage Vitals [04/17/18 1107]  Enc Vitals Group     BP 133/84     Pulse Rate 81     Resp 18     Temp 97.7 F (36.5 C)     Temp Source Oral     SpO2 98 %     Weight      Height      Head Circumference      Peak Flow      Pain Score 6     Pain Loc      Pain Edu?      Excl. in GC?    No data found.  Updated Vital Signs BP 133/84 (BP Location: Left Arm)   Pulse 81   Temp 97.7 F (36.5 C) (Oral)   Resp 18   SpO2 98%   Visual Acuity Right Eye Distance:   Left Eye Distance:   Bilateral Distance:    Right Eye Near:   Left Eye Near:  Bilateral Near:     Physical Exam  Constitutional: He is oriented to person, place, and time. He appears well-developed and well-nourished.  No acute distress  HENT:  Head: Normocephalic and atraumatic.  Nose: Nose normal.  Eyes: Conjunctivae are normal.  Neck: Neck supple.  Cardiovascular: Normal rate.  Pulmonary/Chest: Effort normal. No respiratory distress.  Abdominal: He exhibits no distension.  Musculoskeletal: Normal range of motion.  Mild antalgic gait, minimal tenderness to palpation of plantar surface of right heel, nontender to palpation over first through fifth metatarsals, nontender to palpation over the lateral medial malleolus.  Full active range of motion at ankle.  No overlying swelling or erythema.  Dorsalis pedis 2+  Neurological: He is alert and oriented to person, place, and time.  Skin: Skin is warm and dry.  Psychiatric: He has a normal mood and affect.  Nursing note and vitals reviewed.    UC Treatments / Results  Labs (all labs ordered are  listed, but only abnormal results are displayed) Labs Reviewed - No data to display  EKG None  Radiology No results found.  Procedures Procedures (including critical care time)  Medications Ordered in UC Medications - No data to display  Initial Impression / Assessment and Plan / UC Course  I have reviewed the triage vital signs and the nursing notes.  Pertinent labs & imaging results that were available during my care of the patient were reviewed by me and considered in my medical decision making (see chart for details).     Patient likely with plantar fasciitis, given location of pain and temporality of the pain.  Recommendations below.Discussed strict return precautions. Patient verbalized understanding and is agreeable with plan.  Final Clinical Impressions(s) / UC Diagnoses   Final diagnoses:  Plantar fasciitis     Discharge Instructions     Rest Ice Roll heel on golf ball in am Please be sure to wear shoes that fit and are not worn out Please try stretching calf, foot ankle circles, toe curls Use anti-inflammatories for pain/swelling. You may take up to 800 mg Ibuprofen every 8 hours with food. You may supplement Ibuprofen with Tylenol [phone] mg every 8 hours Or you may try naprosyn sent in   Please return if pain worsening or not improving, may follow up with orthopedics   ED Prescriptions    Medication Sig Dispense Auth. Provider   naproxen (NAPROSYN) 500 MG tablet Take 1 tablet (500 mg total) by mouth 2 (two) times daily. 30 tablet Harrie Cazarez, [place] C, PA-C     Controlled Substance Prescriptions [place] Controlled Substance Registry consulted? Not Applicable   Lew Dawes, [place] 04/17/18 1143

## 2018-05-22 ENCOUNTER — Other Ambulatory Visit: Payer: Self-pay

## 2018-05-22 ENCOUNTER — Encounter (HOSPITAL_COMMUNITY): Payer: Self-pay

## 2018-05-22 ENCOUNTER — Ambulatory Visit (HOSPITAL_COMMUNITY)
Admission: EM | Admit: 2018-05-22 | Discharge: 2018-05-22 | Disposition: A | Discharge status: Home / Self Care | Payer: Self-pay | Attending: Family Medicine | Admitting: Family Medicine

## 2018-05-22 DIAGNOSIS — L02215 Cutaneous abscess of perineum: Secondary | ICD-10-CM

## 2018-05-22 MED ORDER — TRAMADOL HCL 50 MG PO TABS: 50.0000 mg | ORAL_TABLET | Freq: Four times a day (QID) | ORAL | 0 refills | Status: DC | PRN

## 2018-05-22 MED ORDER — IBUPROFEN 800 MG PO TABS: 800.0000 mg | ORAL_TABLET | Freq: Three times a day (TID) | ORAL | 0 refills | Status: AC

## 2018-05-22 MED ORDER — SULFAMETHOXAZOLE-TRIMETHOPRIM 800-160 MG PO TABS: 1.0000 | ORAL_TABLET | Freq: Two times a day (BID) | ORAL | 0 refills | Status: AC

## 2018-05-22 NOTE — ED Provider Notes (Signed)
[facility]    CSN: [id number] Arrival date & time: 05/22/18  1736     History   Chief Complaint Chief Complaint  Patient presents with  . Hemorrhoids    HPI JAMORI BIGGAR is a 47 y.o. male history of hyperlipidemia presenting today for evaluation of rectal pain.  Patient states that for the past 4 days he has had worsening pain and is unable to sit down or work.  He states that he has had issues with this off and on for many years.  He has seen a specialist who did not recommend surgery.  He has been soaking in a bath with Epsom salt which he feels has helped.  He also states that ibuprofen 800 has been helping his pain some.  He denies any drainage from this area.  States his bowel movements are normal, denies straining with bowel movements.  Denies blood in the stool.  Does admit to straining with work, but he is no longer working at this job.  After further discussion/evaluation, patient does state he has had previous abscesses of this area.  HPI  Past Medical History:  Diagnosis Date  . Hyperlipidemia     Patient Active Problem List   Diagnosis Date Noted  . Hemorrhoids, external, thrombosed 05/21/2013    History reviewed. No pertinent surgical history.     Home Medications    Prior to Admission medications   Medication Sig Start Date End Date Taking? Authorizing Provider  ibuprofen (ADVIL,MOTRIN) 800 MG tablet Take 1 tablet (800 mg total) by mouth 3 (three) times daily. 05/22/18   Tyger Wichman C, PA-C  sulfamethoxazole-trimethoprim (BACTRIM DS,SEPTRA DS) 800-160 MG tablet Take 1 tablet by mouth 2 (two) times daily for 7 days. 05/22/18 05/29/18  Cailie Bosshart C, PA-C  traMADol (ULTRAM) 50 MG tablet Take 1 tablet (50 mg total) by mouth every 6 (six) hours as needed. 05/22/18   Dmarius Reeder, Junius Creamer, PA-C    Family History Family History  Problem Relation Age of Onset  . Diabetes Mother   . Diabetes Father     Social History Social History   Tobacco  Use  . Smoking status: Former Smoker    Last attempt to quit: 09/28/2011    Years since quitting: 6.6  . Smokeless tobacco: Never Used  Substance Use Topics  . Alcohol use: Yes    Alcohol/week: 12.0 oz    Types: 20 Cans of beer per week  . Drug use: No     Allergies   Patient has no known allergies.   Review of Systems Review of Systems  Constitutional: Negative for fatigue and fever.  Eyes: Negative for redness, itching and visual disturbance.  Respiratory: Negative for shortness of breath.   Cardiovascular: Negative for chest pain and leg swelling.  Gastrointestinal: Positive for rectal pain. Negative for abdominal pain, blood in stool, nausea and vomiting.  Musculoskeletal: Negative for arthralgias and myalgias.  Skin: Negative for color change, rash and wound.  Neurological: Negative for dizziness, syncope, weakness, light-headedness and headaches.     Physical Exam Triage Vital Signs ED Triage Vitals  Enc Vitals Group     BP 05/22/18 1759 129/76     Pulse Rate 05/22/18 1759 91     Resp 05/22/18 1759 20     Temp 05/22/18 1759 98.3 F (36.8 C)     Temp Source 05/22/18 1759 Oral     SpO2 05/22/18 1759 98 %     Weight 05/22/18 1801 220 lb (99.8  kg)     Height --      Head Circumference --      Peak Flow --      Pain Score 05/22/18 1801 10     Pain Loc --      Pain Edu? --      Excl. in GC? --    No data found.  Updated Vital Signs BP 129/76 (BP Location: Left Arm)   Pulse 91   Temp 98.3 F (36.8 C) (Oral)   Resp 20   Wt 220 lb (99.8 kg)   SpO2 98%   BMI 35.51 kg/m   Visual Acuity Right Eye Distance:   Left Eye Distance:   Bilateral Distance:    Right Eye Near:   Left Eye Near:    Bilateral Near:     Physical Exam  Constitutional: He is oriented to person, place, and time. He appears well-developed and well-nourished.  No acute distress  HENT:  Head: Normocephalic and atraumatic.  Nose: Nose normal.  Eyes: Conjunctivae are normal.  Neck:  Neck supple.  Cardiovascular: Normal rate.  Pulmonary/Chest: Effort normal. No respiratory distress.  Abdominal: He exhibits no distension.  Genitourinary:  Genitourinary Comments: Perineal/rectal area with erythema, small area with head/actively draining small amount of pustular drainage.  Tenderness to palpation overlying peritoneum, erythema slightly extending onto posterior scrotum, area does not feel significantly indurated.  No hemorrhoids seen on exam  Musculoskeletal: Normal range of motion.  Neurological: He is alert and oriented to person, place, and time.  Skin: Skin is warm and dry.  Psychiatric: He has a normal mood and affect.  Nursing note and vitals reviewed.    UC Treatments / Results  Labs (all labs ordered are listed, but only abnormal results are displayed) Labs Reviewed - No data to display  EKG None  Radiology No results found.  Procedures Procedures (including critical care time)  Medications Ordered in UC Medications - No data to display  Initial Impression / Assessment and Plan / UC Course  I have reviewed the triage vital signs and the nursing notes.  Pertinent labs & imaging results that were available during my care of the patient were reviewed by me and considered in my medical decision making (see chart for details).     Recommended patient to go to emergency room for further evaluation of abscess given location near her rectum/scrotum/bladder/penile area.  Patient expressed concern over this given he does not have insurance.  Advised that I still recommend him to go today, but I will give him Bactrim to take twice daily, continue soaks with massage for further drainage.  Advised him to go to the emergency room in the next 24 to 48 hours if not having any improvement with the antibiotic.  Ibuprofen for mild to moderate pain/during the day.  Provided 10 tablets of tramadol to use at bedtime/severe pain.Discussed strict return precautions. Patient  verbalized understanding and is agreeable with plan.  Final Clinical Impressions(s) / UC Diagnoses   Final diagnoses:  Perineal abscess     Discharge Instructions     Please begin taking Bactrim twice daily for the next week Please continue to do warm soaks in the bathtub with massage of this area to express drainage Please go to emergency room in the next 24 to 48 hours if not having any improvement for further evaluation  For pain you may take ibuprofen 800 during the day, may use tramadol at bedtime   ED Prescriptions    Medication  Sig Dispense Auth. Provider   sulfamethoxazole-trimethoprim (BACTRIM DS,SEPTRA DS) 800-160 MG tablet Take 1 tablet by mouth 2 (two) times daily for 7 days. 14 tablet Camrie Stock C, PA-C   ibuprofen (ADVIL,MOTRIN) 800 MG tablet Take 1 tablet (800 mg total) by mouth 3 (three) times daily. 30 tablet Linna Thebeau C, PA-C   traMADol (ULTRAM) 50 MG tablet Take 1 tablet (50 mg total) by mouth every 6 (six) hours as needed. 10 tablet Ciana Simmon, [place] C, PA-C     Controlled Substance Prescriptions [place] Controlled Substance Registry consulted? Not Applicable   Lew DawesWieters, Dennie Moltz C, [place] 05/22/18 2102

## 2018-05-22 NOTE — Discharge Instructions (Signed)
Please begin taking Bactrim twice daily for the next week Please continue to do warm soaks in the bathtub with massage of this area to express drainage Please go to emergency room in the next 24 to 48 hours if not having any improvement for further evaluation  For pain you may take ibuprofen 800 during the day, may use tramadol at bedtime

## 2018-05-22 NOTE — ED Triage Notes (Signed)
Pt has a hemorrhoids...4 days. Pt is unable to sit down.

## 2020-07-07 ENCOUNTER — Ambulatory Visit (HOSPITAL_COMMUNITY)
Admission: EM | Admit: 2020-07-07 | Discharge: 2020-07-07 | Disposition: A | Discharge status: Home / Self Care | Payer: Self-pay | Attending: Family Medicine | Admitting: Family Medicine

## 2020-07-07 ENCOUNTER — Other Ambulatory Visit: Payer: Self-pay

## 2020-07-07 ENCOUNTER — Encounter (HOSPITAL_COMMUNITY): Payer: Self-pay | Admitting: Emergency Medicine

## 2020-07-07 DIAGNOSIS — R195 Other fecal abnormalities: Secondary | ICD-10-CM | POA: Insufficient documentation

## 2020-07-07 DIAGNOSIS — K625 Hemorrhage of anus and rectum: Secondary | ICD-10-CM | POA: Insufficient documentation

## 2020-07-07 DIAGNOSIS — K6289 Other specified diseases of anus and rectum: Secondary | ICD-10-CM | POA: Insufficient documentation

## 2020-07-07 DIAGNOSIS — R202 Paresthesia of skin: Secondary | ICD-10-CM | POA: Insufficient documentation

## 2020-07-07 LAB — CBC
HCT: 48.1 % (ref 39.0–52.0)
Hemoglobin: 15.6 g/dL (ref 13.0–17.0)
MCH: 30.1 pg (ref 26.0–34.0)
MCHC: 32.4 g/dL (ref 30.0–36.0)
MCV: 92.9 fL (ref 80.0–100.0)
Platelets: 380 10*3/uL (ref 150–400)
RBC: 5.18 MIL/uL (ref 4.22–5.81)
RDW: 11.9 % (ref 11.5–15.5)
WBC: 8.3 10*3/uL (ref 4.0–10.5)
nRBC: 0 % (ref 0.0–0.2)

## 2020-07-07 LAB — COMPREHENSIVE METABOLIC PANEL
ALT: 32 U/L (ref 0–44)
AST: 23 U/L (ref 15–41)
Albumin: 3.5 g/dL (ref 3.5–5.0)
Alkaline Phosphatase: 82 U/L (ref 38–126)
Anion gap: 7 (ref 5–15)
BUN: 10 mg/dL (ref 6–20)
CO2: 24 mmol/L (ref 22–32)
Calcium: 8.9 mg/dL (ref 8.9–10.3)
Chloride: 106 mmol/L (ref 98–111)
Creatinine, Ser: 0.77 mg/dL (ref 0.61–1.24)
GFR calc Af Amer: >60 mL/min (ref >60)
GFR calc non Af Amer: >60 mL/min (ref >60)
Glucose, Bld: 112 mg/dL — ABNORMAL HIGH (ref 70–99)
Potassium: 4.7 mmol/L (ref 3.5–5.1)
Sodium: 137 mmol/L (ref 135–145)
Total Bilirubin: 0.7 mg/dL (ref 0.3–1.2)
Total Protein: 7 g/dL (ref 6.5–8.1)

## 2020-07-07 LAB — CBG MONITORING, ED: Glucose-Capillary: 117 mg/dL — ABNORMAL HIGH (ref 70–99)

## 2020-07-07 MED ORDER — POLYETHYLENE GLYCOL 3350 17 GM/SCOOP PO POWD: 17.0000 g | Freq: Every day | ORAL | 0 refills | Status: DC

## 2020-07-07 MED ORDER — HYDROCORTISONE (PERIANAL) 2.5 % EX CREA: 1.0000 "application " | TOPICAL_CREAM | Freq: Two times a day (BID) | CUTANEOUS | 0 refills | Status: DC

## 2020-07-07 MED ORDER — LIDOCAINE 5 % EX OINT: 1.0000 "application " | TOPICAL_OINTMENT | CUTANEOUS | 0 refills | Status: DC | PRN

## 2020-07-07 MED ORDER — NYSTATIN 100000 UNIT/GM EX CREA: TOPICAL_CREAM | CUTANEOUS | 0 refills | Status: DC

## 2020-07-07 NOTE — ED Triage Notes (Addendum)
Patient presents to [facility] for assessment of rectal pain with blood x 1.5 weeks, with a  Hx of hemorrhoids.  States he filled the bowl with blood 2 days ago, but it has been a lesser amount each time since, with only blood with wiping this morning.  Also c/o bilateral foot numbness and tingling for a few weeks, on and off "for a while".

## 2020-07-07 NOTE — Discharge Instructions (Signed)
Use the rectal cream up to 3 times a day Apply the lidocaine to your rectrum as needed  Apply the nystatin cream around your rectal area  Use the miralax daily in water for constipation  Call the internal medicine center today for primary care, take first available and soonest appointment  Call the GI clinic today to discuss soonest available follow up  If your develop severe abdominal pain, faintness or lightheadedness go to the emergency department.  I have sent basic labs, we will call you if anything requires immediate attention

## 2020-07-07 NOTE — ED Provider Notes (Signed)
[facility]    CSN: [id number] Arrival date & time: 07/07/20  1119      History   Chief Complaint Chief Complaint  Patient presents with   Numbness    HPI Michael [name] is a 49 y.o. male.   Patient reports for 2 concerns.  The first of which is he believes to be hemorrhoids.  He reports he has had painful painful bowel movements as well as bright red blood per rectum.  Reports 3 days ago he had quite a bit of blood in the bowl, this has gradually decreased such that now he only has some blood on the paper when wiping.  He reports one episode of a darker color stool, however is otherwise been brown.  He reports this happened after drinking spinach based shake prepared by his wife as well as taking some antacid medicine.  He reports quite a bit of pain with having bowel movements.  He reports intermittent constipation and has felt constipated for the last few days.  He reports he has had issues with hemorrhoids in the past.  Reports he has been evaluated with scans and " scopes" in the past.  He reports he has not followed up with GI specialist lately and does not have primary care.  He denies any abdominal pain.  Denies lightheadedness, chest pain or shortness of breath.  He is not use anything to aid in his symptoms.  Also reports intermittent top of his foot tingling over the last few weeks.  He reports this this occurs primarily the morning does not happen throughout the day.  Reports some lower leg pain after standing for long periods.  Reports this improves with squatting down at times.  Denies swelling.  Denies pain or tingling in clinic here.  Denies injuries.  Denies increased urination, thirst or increased hunger.  Denies change in weight.  No issues with sugar in the past.      Past Medical History:  Diagnosis Date   Hyperlipidemia     Patient Active Problem List   Diagnosis Date Noted   Hemorrhoids, external, thrombosed 05/21/2013    History reviewed.  No pertinent surgical history.     Home Medications    Prior to Admission medications   Medication Sig Start Date End Date Taking? Authorizing Provider  hydrocortisone (ANUSOL-HC) 2.5 % rectal cream Place 1 application rectally 2 (two) times daily. 07/07/20   Nawal Burling, Veryl Speak, PA-C  ibuprofen (ADVIL,MOTRIN) 800 MG tablet Take 1 tablet (800 mg total) by mouth 3 (three) times daily. 05/22/18   Wieters, Hallie C, PA-C  lidocaine (XYLOCAINE) 5 % ointment Apply 1 application topically as needed. 07/07/20   Maisie Hauser, Veryl Speak, PA-C  nystatin cream (MYCOSTATIN) Apply to affected area 2 times daily 07/07/20   Jeannine Pennisi, Veryl Speak, PA-C  polyethylene glycol powder (GLYCOLAX/MIRALAX) 17 GM/SCOOP powder Take 17 g by mouth daily. 07/07/20   Brystol Wasilewski, Veryl Speak, PA-C  traMADol (ULTRAM) 50 MG tablet Take 1 tablet (50 mg total) by mouth every 6 (six) hours as needed. 05/22/18   Wieters, Junius Creamer, PA-C    Family History Family History  Problem Relation Age of Onset   Diabetes Mother    Diabetes Father     Social History Social History   Tobacco Use   Smoking status: Former Smoker    Quit date: 09/28/2011    Years since quitting: 8.7   Smokeless tobacco: Never Used  Substance Use Topics   Alcohol use: Yes  Alcohol/week: 20.0 standard drinks    Types: 20 Cans of beer per week   Drug use: No     Allergies   Patient has no known allergies.   Review of Systems Review of Systems   Physical Exam Triage Vital Signs ED Triage Vitals  Enc Vitals Group     BP 07/07/20 1221 (!) 134/98     Pulse Rate 07/07/20 1221 79     Resp 07/07/20 1221 20     Temp --      Temp Source 07/07/20 1221 Oral     SpO2 07/07/20 1221 98 %     Weight --      Height --      Head Circumference --      Peak Flow --      Pain Score 07/07/20 1222 0     Pain Loc --      Pain Edu? --      Excl. in GC? --    No data found.  Updated Vital Signs BP (!) 134/98 (BP Location: Right Arm)    Pulse 79    Resp 20    SpO2 98%   Visual  Acuity Right Eye Distance:   Left Eye Distance:   Bilateral Distance:    Right Eye Near:   Left Eye Near:    Bilateral Near:     Physical Exam Vitals and nursing note reviewed.  Constitutional:      General: He is not in acute distress.    Appearance: He is well-developed. He is not ill-appearing.  HENT:     Head: Normocephalic and atraumatic.  Eyes:     Conjunctiva/sclera: Conjunctivae normal.  Cardiovascular:     Rate and Rhythm: Normal rate and regular rhythm.     Heart sounds: No murmur heard.   Pulmonary:     Effort: Pulmonary effort is normal. No respiratory distress.     Breath sounds: Normal breath sounds.  Abdominal:     Palpations: Abdomen is soft.     Tenderness: There is no abdominal tenderness.  Genitourinary:    Comments: Thickened skin perirectally and around medial buttocks.,  Some tenderness to palpation in this area.  No external hemorrhoids appreciable.  Appears to be some healing scars from possible abscess or previous sinus tracts.  Rectum is contracted, lots of pain on exam.  No frank blood. Musculoskeletal:     Cervical back: Neck supple.     Right lower leg: No edema.     Left lower leg: No edema.  Skin:    General: Skin is warm and dry.     Findings: No erythema or rash.  Neurological:     General: No focal deficit present.     Mental Status: He is alert and oriented to person, place, and time.     Comments: Sensation equal bilaterally lower extremities.  Strength 5/5.      UC Treatments / Results  Labs (all labs ordered are listed, but only abnormal results are displayed) Labs Reviewed  CBG MONITORING, ED - Abnormal; Notable for the following components:      Result Value   Glucose-Capillary 117 (*)    All other components within normal limits  CBC  COMPREHENSIVE METABOLIC PANEL  Hemoccult positive.  EKG   Radiology No results found.  Procedures Procedures (including critical care time)  Medications Ordered in UC Medications  - No data to display  Initial Impression / Assessment and Plan / UC Course  I have reviewed  the triage vital signs and the nursing notes.  Pertinent labs & imaging results that were available during my care of the patient were reviewed by me and considered in my medical decision making (see chart for details).     #Rectal bleeding #Rectal pain #Positive fecal occult blood test #Paresthesia both feet Patient is a 49 year old gentleman without routine medical care with history of hemorrhoids, perirectal abscess and sinuses per chart review presenting with rectal bleeding, pain and a positive occult fecal test.  CBG 117, doubt diabetic neuropathy.  CBC and CMP sent, H&H normal.  CMP unremarkable with exception of slightly elevated glucose.  Will refer to GI for continued and further work-up.  Is nontachycardic and outside of uncomfortable pain well-appearing, doubt brisk GI bleed.  Will use Anusol, lidocaine and MiraLAX.  Encouraged him to establish with a primary care and take the first available appointment.  Discussed return, follow-up and emergency department precautions.  Patient verbalized agreement understanding plan of care. Final Clinical Impressions(s) / UC Diagnoses   Final diagnoses:  Rectal bleeding  Fecal occult blood test positive  Paresthesia of both feet  Rectal pain     Discharge Instructions     Use the rectal cream up to 3 times a day Apply the lidocaine to your rectrum as needed  Apply the nystatin cream around your rectal area  Use the miralax daily in water for constipation  Call the internal medicine center today for primary care, take first available and soonest appointment  Call the GI clinic today to discuss soonest available follow up  If your develop severe abdominal pain, faintness or lightheadedness go to the emergency department.  I have sent basic labs, we will call you if anything requires immediate attention       ED Prescriptions     Medication Sig Dispense Auth. Provider   polyethylene glycol powder (GLYCOLAX/MIRALAX) 17 GM/SCOOP powder Take 17 g by mouth daily. 255 g Keyanna Sandefer, Veryl Speak, PA-C   hydrocortisone (ANUSOL-HC) 2.5 % rectal cream Place 1 application rectally 2 (two) times daily. 30 g Nobel Brar, Veryl Speak, PA-C   nystatin cream (MYCOSTATIN) Apply to affected area 2 times daily 30 g Lamoyne Hessel, Veryl Speak, PA-C   lidocaine (XYLOCAINE) 5 % ointment Apply 1 application topically as needed. 35.44 g Halynn Reitano, Veryl Speak, PA-C     PDMP not reviewed this encounter.   Hermelinda Medicus, PA-C 07/07/20 1554

## 2020-07-08 ENCOUNTER — Encounter: Payer: Self-pay | Admitting: Gastroenterology

## 2020-07-13 ENCOUNTER — Encounter: Payer: Self-pay | Admitting: Internal Medicine

## 2020-07-17 ENCOUNTER — Ambulatory Visit (INDEPENDENT_AMBULATORY_CARE_PROVIDER_SITE_OTHER): Payer: Self-pay | Admitting: Internal Medicine

## 2020-07-17 ENCOUNTER — Encounter: Payer: Self-pay | Admitting: Internal Medicine

## 2020-07-17 VITALS — BP 129/95 | HR 91 | Wt 231.3 lb

## 2020-07-17 DIAGNOSIS — R0681 Apnea, not elsewhere classified: Secondary | ICD-10-CM

## 2020-07-17 DIAGNOSIS — R29818 Other symptoms and signs involving the nervous system: Secondary | ICD-10-CM

## 2020-07-17 DIAGNOSIS — K645 Perianal venous thrombosis: Secondary | ICD-10-CM

## 2020-07-17 DIAGNOSIS — Z131 Encounter for screening for diabetes mellitus: Secondary | ICD-10-CM

## 2020-07-17 DIAGNOSIS — R0683 Snoring: Secondary | ICD-10-CM

## 2020-07-17 DIAGNOSIS — R5383 Other fatigue: Secondary | ICD-10-CM

## 2020-07-17 DIAGNOSIS — R7303 Prediabetes: Secondary | ICD-10-CM

## 2020-07-17 LAB — POCT GLYCOSYLATED HEMOGLOBIN (HGB A1C): Hemoglobin A1C: 6.3 % — AB (ref 4.0–5.6)

## 2020-07-17 LAB — GLUCOSE, CAPILLARY: Glucose-Capillary: 107 mg/dL — ABNORMAL HIGH (ref 70–99)

## 2020-07-17 NOTE — Patient Instructions (Addendum)
Mr. Michael [name], It was a pleasure meeting you!   Today we discussed:   1. Leg swelling: your lab work from previous clinic was normal. I would avoid salt as much as you can, wear compression stockings and elevate your legs when able.   2. Your hemorrhoids: I'm glad the medications have been helping and that you have an appointment with the specialist.   3. Fatigue, poor sleep: I suspect you may have sleep apnea. I would like to get a sleep study done after you get the orange card.   4. Financial aid: I will refer you to our office staff to start the orange card process.   Take care, Michael [name] [name], Fue un placer conocerte!  Hoy discutimos:  1. Hinchazn de la pierna: su anlisis de laboratorio de la clnica anterior fue normal. Evitara la sal tanto como pueda, usara medias de compresin y Teacher, adult education las piernas cuando pudiera.  2. Tus hemorroides: Me alegra que los medicamentos te hayan ayudado y que tengas una cita con el especialista.  3. Fatiga, falta de sueo: sospecho que puede tener apnea del sueo. Me gustara hacerme un estudio del sueo despus de que recibas la tarjeta naranja.  4. Ayuda financiera: Lo referir al personal de nuestra oficina para comenzar el proceso de la tarjeta naranja.  Cudate, Michael [name] [name]

## 2020-07-20 ENCOUNTER — Encounter: Payer: Self-pay | Admitting: Internal Medicine

## 2020-07-20 DIAGNOSIS — R29818 Other symptoms and signs involving the nervous system: Secondary | ICD-10-CM | POA: Insufficient documentation

## 2020-07-20 DIAGNOSIS — R7303 Prediabetes: Secondary | ICD-10-CM | POA: Insufficient documentation

## 2020-07-20 NOTE — Assessment & Plan Note (Signed)
Patient reports longstanding history of snoring, witnessed apneic events, and daytime fatigue. I suspect he has underlying OSA. Will attempt to get sleep study once he goes through the orange card process.

## 2020-07-20 NOTE — Progress Notes (Signed)
New Patient Office Visit  Subjective:  Patient ID: Michael [name], male    DOB: 1971/03/23  Age: 49 y.o. MRN: [id number]  CC:  Chief Complaint  Patient presents with  . Establish Care  . Foot Swelling    bilateral-more on left (related to eating meat?)  . Hemorrhoids    recently seen by Urgent Care    HPI Michael [name] presents to establish care. He has recently been seen in urgent care clinic for painful hemorrhoids and rectal bleeding. He was started on topical analgesics and a bowel regimen which he states has been quite helpful and denies any current pain or bleeding. He was referred to GI with upcoming appointment at the end of the month.  Patient also reports longstanding history of fatigue. Endorses associated snoring, witnessed apneic events, and waking up feeling poorly rested. He has never had a sleep study done.    Past Medical History:  Diagnosis Date  . Hyperlipidemia     No past surgical history on file.  Family History  Problem Relation Age of Onset  . Diabetes Mother   . Diabetes Father    Social history: previously worked as a [employer] architect, former smoker (quit in 2012), drinks beer weekly, no illicit drug use    ROS Review of Systems  Constitutional: Negative for chills, fever and unexpected weight change.  HENT: Negative for hearing loss and trouble swallowing.   Eyes: Negative for visual disturbance.  Respiratory: Negative for cough and shortness of breath.   Cardiovascular: Negative for chest pain and palpitations.  Gastrointestinal: Negative for blood in stool, constipation, nausea and rectal pain.  Endocrine: Negative for polydipsia, polyphagia and polyuria.  Genitourinary: Negative for difficulty urinating.  Musculoskeletal: Negative for arthralgias and joint swelling.  Skin: Negative for rash.  Neurological: Negative for dizziness and headaches.  Psychiatric/Behavioral: Negative for dysphoric mood.    Objective:   Today's Vitals:  BP (!) 129/95 (BP Location: Right Arm, Patient Position: Sitting, Cuff Size: Normal)   Pulse 91   Wt 231 lb 4.8 oz (104.9 kg)   SpO2 97%   BMI 37.33 kg/m   Physical Exam Constitutional:      General: He is not in acute distress.    Appearance: Normal appearance.  Eyes:     Conjunctiva/sclera: Conjunctivae normal.  Cardiovascular:     Rate and Rhythm: Normal rate and regular rhythm.     Pulses: Normal pulses.  Pulmonary:     Effort: Pulmonary effort is normal.     Breath sounds: Normal breath sounds.  Abdominal:     General: Bowel sounds are normal. There is no distension.     Palpations: Abdomen is soft.     Tenderness: There is no abdominal tenderness.  Musculoskeletal:        General: Normal range of motion.     Cervical back: Neck supple.     Right lower leg: No edema.     Left lower leg: No edema.  Lymphadenopathy:     Cervical: No cervical adenopathy.  Skin:    General: Skin is warm and dry.  Neurological:     General: No focal deficit present.     Mental Status: He is alert.  Psychiatric:        Mood and Affect: Mood normal.        Behavior: Behavior normal.     Assessment & Plan:   Problem List Items Addressed This Visit    None    Visit Diagnoses  Diabetes mellitus screening    -  Primary   Relevant Orders   POC Hbg A1C (Completed)      Outpatient Encounter Medications as of 07/17/2020  Medication Sig  . hydrocortisone (ANUSOL-HC) 2.5 % rectal cream Place 1 application rectally 2 (two) times daily.  Marland Kitchen ibuprofen (ADVIL,MOTRIN) 800 MG tablet Take 1 tablet (800 mg total) by mouth 3 (three) times daily.  Marland Kitchen lidocaine (XYLOCAINE) 5 % ointment Apply 1 application topically as needed.  . nystatin cream (MYCOSTATIN) Apply to affected area 2 times daily  . polyethylene glycol powder (GLYCOLAX/MIRALAX) 17 GM/SCOOP powder Take 17 g by mouth daily.  . traMADol (ULTRAM) 50 MG tablet Take 1 tablet (50 mg total) by mouth every 6 (six) hours as needed.   No  facility-administered encounter medications on file as of 07/17/2020.    Follow-up: Return in about 3 months (around 10/17/2020) for follow-up .   Bridget Hartshorn, DO

## 2020-07-20 NOTE — Assessment & Plan Note (Signed)
Recently seen by urgent care clinic for painful hemorrhoids and rectal bleeding. Reports significant improvement with topical lidocaine and bowel regimen. Per chart review, he also has history of perirectal abscess and sinus tract. He has seen general surgery in the past, but has not required any surgical intervention. He has been referred to GI for further evaluation of hemorrhoids. In the mean time, encouraged him to continue bowel regimen and topical analgesics as needed.

## 2020-07-20 NOTE — Progress Notes (Signed)
Internal Medicine Clinic Attending  Case discussed with Dr. Bloomfield  At the time of the visit.  We reviewed the resident's history and exam and pertinent patient test results.  I agree with the assessment, diagnosis, and plan of care documented in the resident's note.  

## 2020-07-20 NOTE — Assessment & Plan Note (Signed)
Patient has strong family history of diabetes. A1C is in pre-diabetic range. Will recommend lifestyle modifications and continue to monitor A1C.

## 2020-08-10 ENCOUNTER — Ambulatory Visit: Payer: Self-pay | Admitting: Nurse Practitioner

## 2022-01-25 ENCOUNTER — Encounter (HOSPITAL_COMMUNITY): Payer: Self-pay

## 2022-01-25 ENCOUNTER — Ambulatory Visit (HOSPITAL_COMMUNITY)
Admission: RE | Admit: 2022-01-25 | Discharge: 2022-01-25 | Disposition: A | Discharge status: Home / Self Care | Payer: Self-pay | Source: Ambulatory Visit | Attending: Emergency Medicine | Admitting: Emergency Medicine

## 2022-01-25 ENCOUNTER — Other Ambulatory Visit: Payer: Self-pay

## 2022-01-25 VITALS — BP 157/109 | HR 76 | Temp 98.0°F | Resp 18 | Ht 66.0 in | Wt 231.3 lb

## 2022-01-25 DIAGNOSIS — H9201 Otalgia, right ear: Secondary | ICD-10-CM

## 2022-01-25 MED ORDER — NEOMYCIN-POLYMYXIN-HC 3.5-10000-1 OT SUSP: 4.0000 [drp] | Freq: Three times a day (TID) | OTIC | 0 refills | Status: DC

## 2022-01-25 MED ORDER — FLUTICASONE PROPIONATE 50 MCG/ACT NA SUSP: 1.0000 | Freq: Every day | NASAL | 0 refills | Status: DC

## 2022-01-25 NOTE — Discharge Instructions (Signed)
Today you are being treated for your ear pain by covering for fluid that is located behind the ear which is a result of your nasal congestion as well as bacteria ? ?Place 4 drops into your right ear 3 times daily for 7 days, this eardrop has an antibiotic and mild steroid in it ? ?Use nasal spray every morning, this will clear the sinus passages ? ?You may use Tylenol or ibuprofen for management of your pain ? ?You may hold warm compresses or heating pad to your ear for additional comfort ? ?You may follow-up with urgent care as needed for persisting symptoms ? ? ?

## 2022-01-25 NOTE — ED Triage Notes (Signed)
Pt reports right side facial pain that progressed into right ear pain. States he feels like ear is clogged up and he may have an ear infection. ?

## 2022-01-25 NOTE — ED Provider Notes (Signed)
?[facility] ? ? ? ?CSN: [id number] ?Arrival date & time: 01/25/22  0950 ? ? ?  ? ?History   ?Chief Complaint ?Chief Complaint  ?Patient presents with  ? Otalgia  ? ? ?HPI ?Michael [name] is a 51 y.o. male.  ? ?Patient presents with right sided jaw pain radiating to the right ear for 4 days.  Associated mild itching.  Endorses that he has been having nasal congestion and rhinorrhea for the last week.  Has attempted to clean the ear which has been ineffective.  No known sick contacts.  Denies seasonal allergies.  Denies fever, chills, body aches, sore throat, cough, shortness of breath or wheezing or dental pain.  ? ?Past Medical History:  ?Diagnosis Date  ? Hyperlipidemia   ? ? ?Patient Active Problem List  ? Diagnosis Date Noted  ? Prediabetes 07/20/2020  ? Suspected sleep apnea 07/20/2020  ? Hemorrhoids, external, thrombosed 05/21/2013  ? ? ?History reviewed. No pertinent surgical history. ? ? ? ? ?Home Medications   ? ?Prior to Admission medications   ?Medication Sig Start Date End Date Taking? Authorizing Provider  ?hydrocortisone (ANUSOL-HC) 2.5 % rectal cream Place 1 application rectally 2 (two) times daily. 07/07/20   Darr, Gerilyn Pilgrim, PA-C  ?ibuprofen (ADVIL,MOTRIN) 800 MG tablet Take 1 tablet (800 mg total) by mouth 3 (three) times daily. 05/22/18   Wieters, Hallie C, PA-C  ?lidocaine (XYLOCAINE) 5 % ointment Apply 1 application topically as needed. 07/07/20   Darr, Gerilyn Pilgrim, PA-C  ?nystatin cream (MYCOSTATIN) Apply to affected area 2 times daily 07/07/20   Darr, Gerilyn Pilgrim, PA-C  ?polyethylene glycol powder (GLYCOLAX/MIRALAX) 17 GM/SCOOP powder Take 17 g by mouth daily. 07/07/20   Darr, Gerilyn Pilgrim, PA-C  ?traMADol (ULTRAM) 50 MG tablet Take 1 tablet (50 mg total) by mouth every 6 (six) hours as needed. 05/22/18   Wieters, Hallie C, PA-C  ? ? ?Family History ?Family History  ?Problem Relation Age of Onset  ? Diabetes Mother   ? Diabetes Father   ? ? ?Social History ?Social History  ? ?Tobacco Use  ? Smoking status:  Former  ?  Types: Cigarettes  ?  Quit date: 09/28/2011  ?  Years since quitting: 10.3  ? Smokeless tobacco: Never  ?Substance Use Topics  ? Alcohol use: Yes  ?  Alcohol/week: 20.0 standard drinks  ?  Types: 20 Cans of beer per week  ? Drug use: No  ? ? ? ?Allergies   ?Patient has no known allergies. ? ? ?Review of Systems ?Review of Systems  ?Constitutional: Negative.   ?HENT:  Positive for congestion and ear pain. Negative for dental problem, drooling, ear discharge, facial swelling, hearing loss, mouth sores, nosebleeds, postnasal drip, rhinorrhea, sinus pressure, sinus pain, sneezing, sore throat, tinnitus, trouble swallowing and voice change.   ?Respiratory: Negative.    ?Cardiovascular: Negative.   ?Skin: Negative.   ?Neurological: Negative.   ? ? ?Physical Exam ?Triage Vital Signs ?ED Triage Vitals  ?Enc Vitals Group  ?   BP 01/25/22 1027 (!) 157/109  ?   Pulse Rate 01/25/22 1027 76  ?   Resp 01/25/22 1027 18  ?   Temp 01/25/22 1027 98 ?F (36.7 ?C)  ?   Temp Source 01/25/22 1027 Oral  ?   SpO2 01/25/22 1027 94 %  ?   Weight 01/25/22 1025 231 lb 4.2 oz (104.9 kg)  ?   Height 01/25/22 1025 5\' 6"  (1.676 m)  ?   Head Circumference --   ?  Peak Flow --   ?   Pain Score 01/25/22 1025 8  ?   Pain Loc --   ?   Pain Edu? --   ?   Excl. in GC? --   ? ?No data found. ? ?Updated Vital Signs ?BP (!) 157/109 (BP Location: Left Arm)   Pulse 76   Temp 98 ?F (36.7 ?C) (Oral)   Resp 18   Ht 5\' 6"  (1.676 m)   Wt 231 lb 4.2 oz (104.9 kg)   SpO2 94%   BMI 37.33 kg/m?  ? ?Visual Acuity ?Right Eye Distance:   ?Left Eye Distance:   ?Bilateral Distance:   ? ?Right Eye Near:   ?Left Eye Near:    ?Bilateral Near:    ? ?Physical Exam ?Constitutional:   ?   Appearance: Normal appearance.  ?HENT:  ?   Right Ear: Hearing, tympanic membrane and external ear normal.  ?   Left Ear: Tympanic membrane, ear canal and external ear normal.  ?   Ears:  ?   Comments: Tenderness noted on examination of the ear and canal, no drainage or  erythema noted ?Eyes:  ?   Extraocular Movements: Extraocular movements intact.  ?Pulmonary:  ?   Effort: Pulmonary effort is normal.  ?Skin: ?   General: Skin is warm and dry.  ?Neurological:  ?   Mental Status: He is alert and oriented to person, place, and time. Mental status is at baseline.  ?Psychiatric:     ?   Mood and Affect: Mood normal.     ?   Behavior: Behavior normal.  ? ? ? ?UC Treatments / Results  ?Labs ?(all labs ordered are listed, but only abnormal results are displayed) ?Labs Reviewed - No data to display ? ?EKG ? ? ?Radiology ?No results found. ? ?Procedures ?Procedures (including critical care time) ? ?Medications Ordered in UC ?Medications - No data to display ? ?Initial Impression / Assessment and Plan / UC Course  ?I have reviewed the triage vital signs and the nursing notes. ? ?Pertinent labs & imaging results that were available during my care of the patient were reviewed by me and considered in my medical decision making (see chart for details). ? ?Otalgia of the right ear ? ?Etiology of symptoms could be related to nasal congestion from the nasal cavity extending to the tympanic membrane and sinuses, possibly related to infection as tenderness was noted on exam, discussed with patient, will move forward with outpatient treatment of Cortisporin for 7 days and daily Flonase, advised Tylenol and ibuprofen for pain and warm compresses for additional comfort, may follow-up with urgent care for persisting symptoms ?Final Clinical Impressions(s) / UC Diagnoses  ? ?Final diagnoses:  ?None  ? ?Discharge Instructions   ?None ?  ? ?ED Prescriptions   ?None ?  ? ?PDMP not reviewed this encounter. ?  ? , NP ?01/25/22 1047 ? ?

## 2022-05-09 ENCOUNTER — Other Ambulatory Visit: Payer: Self-pay

## 2022-05-09 ENCOUNTER — Emergency Department (HOSPITAL_COMMUNITY): Payer: Self-pay

## 2022-05-09 ENCOUNTER — Observation Stay (HOSPITAL_COMMUNITY)
Admission: EM | Admit: 2022-05-09 | Discharge: 2022-05-11 | Disposition: A | Discharge status: Home / Self Care | Payer: Self-pay | Attending: General Surgery | Admitting: General Surgery

## 2022-05-09 DIAGNOSIS — K611 Rectal abscess: Principal | ICD-10-CM | POA: Insufficient documentation

## 2022-05-09 DIAGNOSIS — K610 Anal abscess: Secondary | ICD-10-CM

## 2022-05-09 DIAGNOSIS — Z87891 Personal history of nicotine dependence: Secondary | ICD-10-CM | POA: Insufficient documentation

## 2022-05-09 DIAGNOSIS — Z79899 Other long term (current) drug therapy: Secondary | ICD-10-CM | POA: Insufficient documentation

## 2022-05-09 HISTORY — DX: Prediabetes: R73.03

## 2022-05-09 LAB — CBC WITH DIFFERENTIAL/PLATELET
Abs Immature Granulocytes: 0.11 10*3/uL — ABNORMAL HIGH (ref 0.00–0.07)
Basophils Absolute: 0.1 10*3/uL (ref 0.0–0.1)
Basophils Relative: 1 %
Eosinophils Absolute: 0.4 10*3/uL (ref 0.0–0.5)
Eosinophils Relative: 3 %
HCT: 47.4 % (ref 39.0–52.0)
Hemoglobin: 15.6 g/dL (ref 13.0–17.0)
Immature Granulocytes: 1 %
Lymphocytes Relative: 13 %
Lymphs Abs: 1.8 10*3/uL (ref 0.7–4.0)
MCH: 31.3 pg (ref 26.0–34.0)
MCHC: 32.9 g/dL (ref 30.0–36.0)
MCV: 95 fL (ref 80.0–100.0)
Monocytes Absolute: 1.2 10*3/uL — ABNORMAL HIGH (ref 0.1–1.0)
Monocytes Relative: 8 %
Neutro Abs: 11 10*3/uL — ABNORMAL HIGH (ref 1.7–7.7)
Neutrophils Relative %: 74 %
Platelets: 368 10*3/uL (ref 150–400)
RBC: 4.99 MIL/uL (ref 4.22–5.81)
RDW: 12.1 % (ref 11.5–15.5)
WBC: 14.5 10*3/uL — ABNORMAL HIGH (ref 4.0–10.5)
nRBC: 0 % (ref 0.0–0.2)

## 2022-05-09 LAB — I-STAT CHEM 8, ED
BUN: 16 mg/dL (ref 6–20)
Calcium, Ion: 1.22 mmol/L (ref 1.15–1.40)
Chloride: 103 mmol/L (ref 98–111)
Creatinine, Ser: 0.8 mg/dL (ref 0.61–1.24)
Glucose, Bld: 130 mg/dL — ABNORMAL HIGH (ref 70–99)
HCT: 47 % (ref 39.0–52.0)
Hemoglobin: 16 g/dL (ref 13.0–17.0)
Potassium: 3.8 mmol/L (ref 3.5–5.1)
Sodium: 139 mmol/L (ref 135–145)
TCO2: 29 mmol/L (ref 22–32)

## 2022-05-09 LAB — COMPREHENSIVE METABOLIC PANEL
ALT: 32 U/L (ref 0–44)
AST: 15 U/L (ref 15–41)
Albumin: 3.3 g/dL — ABNORMAL LOW (ref 3.5–5.0)
Alkaline Phosphatase: 103 U/L (ref 38–126)
Anion gap: 7 (ref 5–15)
BUN: 12 mg/dL (ref 6–20)
CO2: 27 mmol/L (ref 22–32)
Calcium: 9 mg/dL (ref 8.9–10.3)
Chloride: 105 mmol/L (ref 98–111)
Creatinine, Ser: 0.93 mg/dL (ref 0.61–1.24)
GFR, Estimated: >60 mL/min (ref >60)
Glucose, Bld: 132 mg/dL — ABNORMAL HIGH (ref 70–99)
Potassium: 4.1 mmol/L (ref 3.5–5.1)
Sodium: 139 mmol/L (ref 135–145)
Total Bilirubin: 0.7 mg/dL (ref 0.3–1.2)
Total Protein: 7.2 g/dL (ref 6.5–8.1)

## 2022-05-09 MED ORDER — OXYCODONE-ACETAMINOPHEN 5-325 MG PO TABS
2.0000 | ORAL_TABLET | Freq: Once | ORAL | Status: AC
  Administered 2022-05-09: 2 via ORAL
  Filled 2022-05-09: qty 2

## 2022-05-09 MED ORDER — VANCOMYCIN HCL IN DEXTROSE 1-5 GM/200ML-% IV SOLN
1000.0000 mg | Freq: Once | INTRAVENOUS | Status: AC
  Administered 2022-05-09: 1000 mg via INTRAVENOUS
  Filled 2022-05-09: qty 200

## 2022-05-09 MED ORDER — LIDOCAINE HCL (PF) 1 % IJ SOLN
INTRAMUSCULAR | Status: AC
  Filled 2022-05-09: qty 30

## 2022-05-09 MED ORDER — IOHEXOL 300 MG/ML  SOLN
100.0000 mL | Freq: Once | INTRAMUSCULAR | Status: AC | PRN
  Administered 2022-05-09: 100 mL via INTRAVENOUS

## 2022-05-09 MED ORDER — ONDANSETRON 4 MG PO TBDP
4.0000 mg | ORAL_TABLET | Freq: Once | ORAL | Status: AC
  Administered 2022-05-09: 4 mg via ORAL
  Filled 2022-05-09: qty 1

## 2022-05-09 MED ORDER — CLINDAMYCIN HCL 150 MG PO CAPS: 300.0000 mg | ORAL_CAPSULE | Freq: Once | ORAL | Status: DC

## 2022-05-09 MED ORDER — MORPHINE SULFATE (PF) 4 MG/ML IV SOLN
4.0000 mg | Freq: Once | INTRAVENOUS | Status: AC
  Administered 2022-05-09: 4 mg via INTRAVENOUS
  Filled 2022-05-09 (×2): qty 1

## 2022-05-09 NOTE — ED Provider Triage Note (Signed)
Emergency Medicine Provider Triage Evaluation Note  Michael [name] , a 51 y.o. male  was evaluated in triage.  Pt complains of days of severe worsening pain in his perineum.  He has no history of same does not see doctors unsure if he is diabetic but has a family history of it.  Complains of severe pain between his testicles and anus.  No drainage, fevers and chills at home subjectively..  Review of Systems  Positive: Perineal pain Negative: Discharge  Physical Exam  There were no vitals taken for this visit. Gen:   Awake, no distress   Resp:  Normal effort  MSK:   Moves extremities without difficulty  Other:  Extensive erythema and swelling of the perineum  Medical Decision Making  Medically screening exam initiated at 5:08 PM.  Appropriate orders placed.  CONAL SHETLEY was informed that the remainder of the evaluation will be completed by another provider, this initial triage assessment does not replace that evaluation, and the importance of remaining in the ED until their evaluation is complete.  Work-up initiated including CT pelvis   Arthor Captain, PA-C 05/09/22 1711

## 2022-05-09 NOTE — ED Provider Notes (Addendum)
[facility] EMERGENCY DEPARTMENT Provider Note   CSN: [id number] Arrival date & time: 05/09/22  1619     History  Chief Complaint  Patient presents with   Abscess    perineum    Michael [name] is a 51 y.o. male.  Patient presents chief complaint of swelling and pain in his lower groin area.  Symptoms ongoing for the past week.  He states that symptoms have gotten worse in the last few days and presents to the ER.  Positive subjective fevers at home, otherwise does not have a thermometer.  No reports of any vomiting or diarrhea.  No prior similar episodes per patient.       Home Medications Prior to Admission medications   Medication Sig Start Date End Date Taking? Authorizing Provider  fluticasone (FLONASE) 50 MCG/ACT nasal spray Place 1 spray into both nostrils daily. 01/25/22   White, Elita Boone, NP  hydrocortisone (ANUSOL-HC) 2.5 % rectal cream Place 1 application rectally 2 (two) times daily. 07/07/20   Darr, Gerilyn Pilgrim, PA-C  ibuprofen (ADVIL,MOTRIN) 800 MG tablet Take 1 tablet (800 mg total) by mouth 3 (three) times daily. 05/22/18   Wieters, Hallie C, PA-C  lidocaine (XYLOCAINE) 5 % ointment Apply 1 application topically as needed. 07/07/20   Darr, Gerilyn Pilgrim, PA-C  neomycin-polymyxin-hydrocortisone (CORTISPORIN) 3.5-10000-1 OTIC suspension Place 4 drops into the right ear 3 (three) times daily. 01/25/22   Valinda Hoar, NP  nystatin cream (MYCOSTATIN) Apply to affected area 2 times daily 07/07/20   Darr, Gerilyn Pilgrim, PA-C  polyethylene glycol powder (GLYCOLAX/MIRALAX) 17 GM/SCOOP powder Take 17 g by mouth daily. 07/07/20   Darr, Gerilyn Pilgrim, PA-C  traMADol (ULTRAM) 50 MG tablet Take 1 tablet (50 mg total) by mouth every 6 (six) hours as needed. 05/22/18   Wieters, Hallie C, PA-C      Allergies    Patient has no known allergies.    Review of Systems   Review of Systems  Constitutional:  Negative for fever.  HENT:  Negative for ear pain and sore throat.   Eyes:  Negative  for pain.  Respiratory:  Negative for cough.   Cardiovascular:  Negative for chest pain.  Gastrointestinal:  Negative for abdominal pain.  Genitourinary:  Negative for flank pain.  Musculoskeletal:  Negative for back pain.  Skin:  Negative for color change and rash.  Neurological:  Negative for syncope.  All other systems reviewed and are negative.   Physical Exam Updated Vital Signs BP (!) 141/97   Pulse 95   Temp 98.4 F (36.9 C) (Oral)   Resp 18   SpO2 96%  Physical Exam Constitutional:      Appearance: He is well-developed.  HENT:     Head: Normocephalic.     Nose: Nose normal.  Eyes:     Extraocular Movements: Extraocular movements intact.  Cardiovascular:     Rate and Rhythm: Normal rate.  Pulmonary:     Effort: Pulmonary effort is normal.  Skin:    Coloration: Skin is not jaundiced.     Comments: Perineal fullness tenderness and swelling noted.  No surrounding cellulitis noted.  Neurological:     Mental Status: He is alert. Mental status is at baseline.     ED Results / Procedures / Treatments   Labs (all labs ordered are listed, but only abnormal results are displayed) Labs Reviewed  COMPREHENSIVE METABOLIC PANEL - Abnormal; Notable for the following components:      Result Value   Glucose, Bld 132 (*)  Albumin 3.3 (*)    All other components within normal limits  CBC WITH DIFFERENTIAL/PLATELET - Abnormal; Notable for the following components:   WBC 14.5 (*)    Neutro Abs 11.0 (*)    Monocytes Absolute 1.2 (*)    Abs Immature Granulocytes 0.11 (*)    All other components within normal limits  I-STAT CHEM 8, ED - Abnormal; Notable for the following components:   Glucose, Bld 130 (*)    All other components within normal limits  CBG MONITORING, ED    EKG None  Radiology CT PELVIS W CONTRAST  Result Date: 05/09/2022 CLINICAL DATA:  Anal/rectal abscess. Worsening pain in the perineum. EXAM: CT PELVIS WITH CONTRAST TECHNIQUE: Multidetector CT  imaging of the pelvis was performed using the standard protocol following the bolus administration of intravenous contrast. RADIATION DOSE REDUCTION: This exam was performed according to the departmental dose-optimization program which includes automated exposure control, adjustment of the mA and/or kV according to patient size and/or use of iterative reconstruction technique. CONTRAST:  OMNIPAQUE IOHEXOL 300 MG/ML  SOLN COMPARISON:  CT examination dated August 20, 2013. FINDINGS: Urinary Tract:  No abnormality visualized. Bowel: Bowel loops are normal in caliber. Normal appendix. There is a right perianal peripherally enhancing fluid collection measuring at least 5.2 x 2.5 x 7.1 cm with mild surrounding soft tissue swelling. Vascular/Lymphatic: No pathologically enlarged lymph nodes. No significant vascular abnormality seen. Reproductive: Prostate is normal in size with central coarse calcifications. Other:  None. Musculoskeletal: No suspicious bone lesions identified. IMPRESSION: 1. Right perianal peripherally enhancing fluid collection measuring at least 5.2 x 2.5 x 7.1 cm with mild surrounding soft tissue swelling, most consistent with perianal abscess. Surgical consultation for further management would be helpful. Electronically Signed   By: Larose Hires D.O.   On: 05/09/2022 23:05    Procedures .Marland KitchenIncision and Drainage  Date/Time: 05/09/2022 11:35 PM  Performed by: Cheryll Cockayne, MD Authorized by: Cheryll Cockayne, MD   Comments:     Incision and drainage performed to the patient's perineum.  Site prepped with Betadine x3.  25-gauge needle used to instill about 2 cc of 1% lidocaine no epinephrine into the area.  11 blade used to make a 2 cm linear incision.  Moderate amount of bloody fluid expressed.  Unclear if this was actual pocket of pus.  Wound was packed with quarter inch iodoform gauze.     Medications Ordered in ED Medications  vancomycin (VANCOCIN) IVPB 1000 mg/200 mL premix (1,000  mg Intravenous New Bag/Given 05/09/22 2312)  lidocaine (PF) (XYLOCAINE) 1 % injection (has no administration in time range)  oxyCODONE-acetaminophen (PERCOCET/ROXICET) 5-325 MG per tablet 2 tablet (2 tablets Oral Given 05/09/22 1716)  ondansetron (ZOFRAN-ODT) disintegrating tablet 4 mg (4 mg Oral Given 05/09/22 1716)  morphine (PF) 4 MG/ML injection 4 mg (4 mg Intravenous Given 05/09/22 2322)  iohexol (OMNIPAQUE) 300 MG/ML solution 100 mL (100 mLs Intravenous Contrast Given 05/09/22 2256)    ED Course/ Medical Decision Making/ A&P                           Medical Decision Making Risk Prescription drug management.   Review of record shows an ER visit for or ear pain January 25, 2022.  Work-up today includes labs White count of 14.5 chemistry remarkable.  CT of the pelvis ordered 5 x 7 x 2 cm abscess perianal noted.  Patient started on IV vancomycin.  Bedside incision and  drainage attempted, unsure if I was able to reach the pocket of pus.  Surgical consultation requested.    Final Clinical Impression(s) / ED Diagnoses Final diagnoses:  Perianal abscess    Rx / DC Orders ED Discharge Orders     None         Cheryll Cockayne, MD 05/09/22 2201    Cheryll Cockayne, MD 05/09/22 2358

## 2022-05-09 NOTE — ED Notes (Signed)
Patient transported to CT 

## 2022-05-10 ENCOUNTER — Encounter (HOSPITAL_COMMUNITY)
Admission: EM | Disposition: A | Discharge status: Home / Self Care | Payer: Self-pay | Source: Home / Self Care | Attending: Emergency Medicine

## 2022-05-10 ENCOUNTER — Observation Stay (HOSPITAL_COMMUNITY): Payer: Self-pay | Admitting: Anesthesiology

## 2022-05-10 ENCOUNTER — Encounter (HOSPITAL_COMMUNITY): Payer: Self-pay

## 2022-05-10 ENCOUNTER — Other Ambulatory Visit: Payer: Self-pay

## 2022-05-10 ENCOUNTER — Observation Stay (HOSPITAL_BASED_OUTPATIENT_CLINIC_OR_DEPARTMENT_OTHER): Payer: Self-pay | Admitting: Anesthesiology

## 2022-05-10 DIAGNOSIS — K611 Rectal abscess: Secondary | ICD-10-CM | POA: Diagnosis present

## 2022-05-10 DIAGNOSIS — E785 Hyperlipidemia, unspecified: Secondary | ICD-10-CM

## 2022-05-10 DIAGNOSIS — Z6837 Body mass index (BMI) 37.0-37.9, adult: Secondary | ICD-10-CM

## 2022-05-10 HISTORY — PX: INCISION AND DRAINAGE PERIRECTAL ABSCESS: SHX1804

## 2022-05-10 LAB — CBC
HCT: 42.9 % (ref 39.0–52.0)
Hemoglobin: 14.4 g/dL (ref 13.0–17.0)
MCH: 31.3 pg (ref 26.0–34.0)
MCHC: 33.6 g/dL (ref 30.0–36.0)
MCV: 93.3 fL (ref 80.0–100.0)
Platelets: 353 10*3/uL (ref 150–400)
RBC: 4.6 MIL/uL (ref 4.22–5.81)
RDW: 12.2 % (ref 11.5–15.5)
WBC: 13.1 10*3/uL — ABNORMAL HIGH (ref 4.0–10.5)
nRBC: 0 % (ref 0.0–0.2)

## 2022-05-10 LAB — BASIC METABOLIC PANEL
Anion gap: 7 (ref 5–15)
BUN: 11 mg/dL (ref 6–20)
CO2: 25 mmol/L (ref 22–32)
Calcium: 8.2 mg/dL — ABNORMAL LOW (ref 8.9–10.3)
Chloride: 104 mmol/L (ref 98–111)
Creatinine, Ser: 0.82 mg/dL (ref 0.61–1.24)
GFR, Estimated: >60 mL/min (ref >60)
Glucose, Bld: 128 mg/dL — ABNORMAL HIGH (ref 70–99)
Potassium: 3.6 mmol/L (ref 3.5–5.1)
Sodium: 136 mmol/L (ref 135–145)

## 2022-05-10 LAB — HIV ANTIBODY (ROUTINE TESTING W REFLEX): HIV Screen 4th Generation wRfx: NONREACTIVE

## 2022-05-10 SURGERY — INCISION AND DRAINAGE, ABSCESS, PERIRECTAL
Anesthesia: General | Site: Perineum

## 2022-05-10 MED ORDER — MORPHINE SULFATE (PF) 2 MG/ML IV SOLN
1.0000 mg | INTRAVENOUS | Status: DC | PRN
  Administered 2022-05-10: 2 mg via INTRAVENOUS
  Filled 2022-05-10: qty 1

## 2022-05-10 MED ORDER — LACTATED RINGERS IV SOLN: INTRAVENOUS | Status: DC | PRN

## 2022-05-10 MED ORDER — ONDANSETRON HCL 4 MG/2ML IJ SOLN: 4.0000 mg | Freq: Four times a day (QID) | INTRAMUSCULAR | Status: DC | PRN

## 2022-05-10 MED ORDER — MIDAZOLAM HCL 2 MG/2ML IJ SOLN
INTRAMUSCULAR | Status: AC
  Filled 2022-05-10: qty 2

## 2022-05-10 MED ORDER — LIDOCAINE 2% (20 MG/ML) 5 ML SYRINGE
INTRAMUSCULAR | Status: DC | PRN
  Administered 2022-05-10: 100 mg via INTRAVENOUS

## 2022-05-10 MED ORDER — OXYCODONE HCL 5 MG PO TABS
5.0000 mg | ORAL_TABLET | ORAL | Status: DC | PRN
  Administered 2022-05-10 (×2): 5 mg via ORAL
  Administered 2022-05-10 – 2022-05-11 (×2): 10 mg via ORAL
  Filled 2022-05-10: qty 2
  Filled 2022-05-10: qty 1
  Filled 2022-05-10: qty 2

## 2022-05-10 MED ORDER — DOCUSATE SODIUM 100 MG PO CAPS
100.0000 mg | ORAL_CAPSULE | Freq: Two times a day (BID) | ORAL | Status: DC
  Administered 2022-05-10 (×3): 100 mg via ORAL
  Filled 2022-05-10 (×4): qty 1

## 2022-05-10 MED ORDER — PIPERACILLIN-TAZOBACTAM 3.375 G IVPB
3.3750 g | Freq: Three times a day (TID) | INTRAVENOUS | Status: DC
  Administered 2022-05-10 – 2022-05-11 (×3): 3.375 g via INTRAVENOUS
  Filled 2022-05-10 (×5): qty 50

## 2022-05-10 MED ORDER — KCL IN DEXTROSE-NACL 20-5-0.45 MEQ/L-%-% IV SOLN
INTRAVENOUS | Status: DC
  Filled 2022-05-10 (×3): qty 1000

## 2022-05-10 MED ORDER — 0.9 % SODIUM CHLORIDE (POUR BTL) OPTIME
TOPICAL | Status: DC | PRN
  Administered 2022-05-10: 1000 mL

## 2022-05-10 MED ORDER — ROCURONIUM BROMIDE 10 MG/ML (PF) SYRINGE
PREFILLED_SYRINGE | INTRAVENOUS | Status: AC
Start: 2022-05-10 — End: ?
  Filled 2022-05-10: qty 20

## 2022-05-10 MED ORDER — ROCURONIUM BROMIDE 10 MG/ML (PF) SYRINGE
PREFILLED_SYRINGE | INTRAVENOUS | Status: DC | PRN
  Administered 2022-05-10: 100 mg via INTRAVENOUS

## 2022-05-10 MED ORDER — ONDANSETRON HCL 4 MG/2ML IJ SOLN
INTRAMUSCULAR | Status: DC | PRN
  Administered 2022-05-10: 4 mg via INTRAVENOUS

## 2022-05-10 MED ORDER — LIDOCAINE 2% (20 MG/ML) 5 ML SYRINGE
INTRAMUSCULAR | Status: AC
  Filled 2022-05-10: qty 25

## 2022-05-10 MED ORDER — VANCOMYCIN HCL 750 MG/150ML IV SOLN
750.0000 mg | Freq: Three times a day (TID) | INTRAVENOUS | Status: DC
  Administered 2022-05-10 – 2022-05-11 (×4): 750 mg via INTRAVENOUS
  Filled 2022-05-10 (×8): qty 150

## 2022-05-10 MED ORDER — LACTATED RINGERS IV SOLN: INTRAVENOUS | Status: DC

## 2022-05-10 MED ORDER — PROPOFOL 10 MG/ML IV BOLUS
INTRAVENOUS | Status: DC | PRN
  Administered 2022-05-10: 50 mg via INTRAVENOUS
  Administered 2022-05-10: 100 mg via INTRAVENOUS

## 2022-05-10 MED ORDER — ARTIFICIAL TEARS OPHTHALMIC OINT
TOPICAL_OINTMENT | OPHTHALMIC | Status: AC
  Filled 2022-05-10: qty 3.5

## 2022-05-10 MED ORDER — ENOXAPARIN SODIUM 40 MG/0.4ML IJ SOSY: 40.0000 mg | PREFILLED_SYRINGE | INTRAMUSCULAR | Status: DC

## 2022-05-10 MED ORDER — OXYCODONE HCL 5 MG PO TABS
ORAL_TABLET | ORAL | Status: AC
  Filled 2022-05-10: qty 1

## 2022-05-10 MED ORDER — ORAL CARE MOUTH RINSE: 15.0000 mL | Freq: Once | OROMUCOSAL | Status: DC

## 2022-05-10 MED ORDER — SUGAMMADEX SODIUM 200 MG/2ML IV SOLN
INTRAVENOUS | Status: DC | PRN
  Administered 2022-05-10 (×2): 200 mg via INTRAVENOUS

## 2022-05-10 MED ORDER — DEXAMETHASONE SODIUM PHOSPHATE 10 MG/ML IJ SOLN
INTRAMUSCULAR | Status: DC | PRN
  Administered 2022-05-10: 10 mg via INTRAVENOUS

## 2022-05-10 MED ORDER — SUCCINYLCHOLINE CHLORIDE 200 MG/10ML IV SOSY
PREFILLED_SYRINGE | INTRAVENOUS | Status: AC
Start: 2022-05-10 — End: ?
  Filled 2022-05-10: qty 10

## 2022-05-10 MED ORDER — SURGILUBE EX GEL
CUTANEOUS | Status: DC | PRN
  Administered 2022-05-10: 1 via TOPICAL

## 2022-05-10 MED ORDER — SIMETHICONE 80 MG PO CHEW: 40.0000 mg | CHEWABLE_TABLET | Freq: Four times a day (QID) | ORAL | Status: DC | PRN

## 2022-05-10 MED ORDER — PIPERACILLIN-TAZOBACTAM 3.375 G IVPB 30 MIN
3.3750 g | Freq: Once | INTRAVENOUS | Status: AC
  Administered 2022-05-10: 3.375 g via INTRAVENOUS
  Filled 2022-05-10: qty 50

## 2022-05-10 MED ORDER — POLYETHYLENE GLYCOL 3350 17 G PO PACK: 17.0000 g | PACK | Freq: Every day | ORAL | Status: DC | PRN

## 2022-05-10 MED ORDER — DEXAMETHASONE SODIUM PHOSPHATE 10 MG/ML IJ SOLN
INTRAMUSCULAR | Status: AC
  Filled 2022-05-10: qty 3

## 2022-05-10 MED ORDER — ONDANSETRON 4 MG PO TBDP: 4.0000 mg | ORAL_TABLET | Freq: Four times a day (QID) | ORAL | Status: DC | PRN

## 2022-05-10 MED ORDER — CHLORHEXIDINE GLUCONATE 0.12 % MT SOLN
OROMUCOSAL | Status: AC
  Administered 2022-05-10: 15 mL
  Filled 2022-05-10: qty 15

## 2022-05-10 MED ORDER — FENTANYL CITRATE (PF) 250 MCG/5ML IJ SOLN
INTRAMUSCULAR | Status: DC | PRN
  Administered 2022-05-10: 100 ug via INTRAVENOUS

## 2022-05-10 MED ORDER — MIDAZOLAM HCL 2 MG/2ML IJ SOLN
INTRAMUSCULAR | Status: DC | PRN
  Administered 2022-05-10: 2 mg via INTRAVENOUS

## 2022-05-10 MED ORDER — FENTANYL CITRATE (PF) 250 MCG/5ML IJ SOLN
INTRAMUSCULAR | Status: AC
  Filled 2022-05-10: qty 5

## 2022-05-10 MED ORDER — ACETAMINOPHEN 500 MG PO TABS
1000.0000 mg | ORAL_TABLET | Freq: Four times a day (QID) | ORAL | Status: DC
  Administered 2022-05-10 – 2022-05-11 (×6): 1000 mg via ORAL
  Filled 2022-05-10 (×5): qty 2

## 2022-05-10 MED ORDER — CHLORHEXIDINE GLUCONATE 0.12 % MT SOLN: 15.0000 mL | Freq: Once | OROMUCOSAL | Status: DC

## 2022-05-10 MED ORDER — ONDANSETRON HCL 4 MG/2ML IJ SOLN
INTRAMUSCULAR | Status: AC
  Filled 2022-05-10: qty 6

## 2022-05-10 SURGICAL SUPPLY — 27 items
BAG COUNTER SPONGE SURGICOUNT (BAG) ×2 IMPLANT
BNDG GAUZE ELAST 4 BULKY (GAUZE/BANDAGES/DRESSINGS) IMPLANT
COVER MAYO STAND STRL (DRAPES) ×2 IMPLANT
COVER SURGICAL LIGHT HANDLE (MISCELLANEOUS) ×2 IMPLANT
DRAIN PENROSE 0.25X12 (DRAIN) ×1 IMPLANT
ELECT REM PT RETURN 9FT ADLT (ELECTROSURGICAL) ×2
ELECTRODE REM PT RTRN 9FT ADLT (ELECTROSURGICAL) ×1 IMPLANT
GAUZE SPONGE 4X4 12PLY STRL (GAUZE/BANDAGES/DRESSINGS) IMPLANT
GLOVE BIOGEL PI IND STRL 7.0 (GLOVE) ×1 IMPLANT
GLOVE BIOGEL PI INDICATOR 7.0 (GLOVE) ×2
GLOVE SURG SS PI 7.0 STRL IVOR (GLOVE) ×2 IMPLANT
GOWN STRL REUS W/ TWL LRG LVL3 (GOWN DISPOSABLE) ×2 IMPLANT
GOWN STRL REUS W/TWL LRG LVL3 (GOWN DISPOSABLE) ×4
KIT BASIN OR (CUSTOM PROCEDURE TRAY) ×2 IMPLANT
KIT TURNOVER KIT B (KITS) ×2 IMPLANT
NS IRRIG 1000ML POUR BTL (IV SOLUTION) ×2 IMPLANT
PACK LITHOTOMY IV (CUSTOM PROCEDURE TRAY) ×2 IMPLANT
PAD ARMBOARD 7.5X6 YLW CONV (MISCELLANEOUS) ×2 IMPLANT
PENCIL SMOKE EVACUATOR (MISCELLANEOUS) ×2 IMPLANT
SPONGE T-LAP 18X18 ~~LOC~~+RFID (SPONGE) ×2 IMPLANT
SURGILUBE 2OZ TUBE FLIPTOP (MISCELLANEOUS) ×2 IMPLANT
SUT SILK 2 0 SH (SUTURE) ×1 IMPLANT
SYR BULB EAR ULCER 3OZ GRN STR (SYRINGE) ×2 IMPLANT
TOWEL GREEN STERILE (TOWEL DISPOSABLE) ×2 IMPLANT
TOWEL GREEN STERILE FF (TOWEL DISPOSABLE) ×2 IMPLANT
TUBE CONNECTING 12X1/4 (SUCTIONS) ×2 IMPLANT
YANKAUER SUCT BULB TIP NO VENT (SUCTIONS) ×2 IMPLANT

## 2022-05-10 NOTE — H&P (Signed)
Reason for Consult: abscess Referring Provider: Sherin Quarry  Michael [name] is an 51 y.o. male.  HPI: 51 yo male with 4 days of perianal pain. He has had this happen 4 times in the last 2 years. He smokes 1/4-1/2 pack per day. He denies drainage. He had fevers at home. The area is very painful and the pain is constant.  Past Medical History:  Diagnosis Date   Hyperlipidemia     No past surgical history on file.  Family History  Problem Relation Age of Onset   Diabetes Mother    Diabetes Father     Social History:  reports that he quit smoking about 10 years ago. He has never used smokeless tobacco. He reports current alcohol use of about 20.0 standard drinks of alcohol per week. He reports that he does not use drugs.  Allergies: No Known Allergies  Medications: I have reviewed the patient's current medications.  Results for orders placed or performed during the hospital encounter of 05/09/22 (from the past 48 hour(s))  Comprehensive metabolic panel     Status: Abnormal   Collection Time: 05/09/22  5:25 PM  Result Value Ref Range   Sodium 139 135 - 145 mmol/L   Potassium 4.1 3.5 - 5.1 mmol/L   Chloride 105 98 - 111 mmol/L   CO2 27 22 - 32 mmol/L   Glucose, Bld 132 (H) 70 - 99 mg/dL    Comment: Glucose reference range applies only to samples taken after fasting for at least 8 hours.   BUN 12 6 - 20 mg/dL   Creatinine, Ser 3.81 0.61 - 1.24 mg/dL   Calcium 9.0 8.9 - 82.9 mg/dL   Total Protein 7.2 6.5 - 8.1 g/dL   Albumin 3.3 (L) 3.5 - 5.0 g/dL   AST 15 15 - 41 U/L   ALT 32 0 - 44 U/L   Alkaline Phosphatase 103 38 - 126 U/L   Total Bilirubin 0.7 0.3 - 1.2 mg/dL   GFR, Estimated >93 >71 mL/min    Comment: (NOTE) Calculated using the CKD-EPI Creatinine Equation (2021)    Anion gap 7 5 - 15    Comment: Performed at [facility], [address], [place], [place] [address]  CBC with Differential     Status: Abnormal   Collection Time: 05/09/22  5:25 PM  Result  Value Ref Range   WBC 14.5 (H) 4.0 - 10.5 K/uL   RBC 4.99 4.22 - 5.81 MIL/uL   Hemoglobin 15.6 13.0 - 17.0 g/dL   HCT 93.8 10.1 - 75.1 %   MCV 95.0 80.0 - 100.0 fL   MCH 31.3 26.0 - 34.0 pg   MCHC 32.9 30.0 - 36.0 g/dL   RDW 02.5 85.2 - 77.8 %   Platelets 368 150 - 400 K/uL   nRBC 0.0 0.0 - 0.2 %   Neutrophils Relative % 74 %   Neutro Abs 11.0 (H) 1.7 - 7.7 K/uL   Lymphocytes Relative 13 %   Lymphs Abs 1.8 0.7 - 4.0 K/uL   Monocytes Relative 8 %   Monocytes Absolute 1.2 (H) 0.1 - 1.0 K/uL   Eosinophils Relative 3 %   Eosinophils Absolute 0.4 0.0 - 0.5 K/uL   Basophils Relative 1 %   Basophils Absolute 0.1 0.0 - 0.1 K/uL   Immature Granulocytes 1 %   Abs Immature Granulocytes 0.11 (H) 0.00 - 0.07 K/uL    Comment: Performed at [facility], [address], [place],  [place] [address]  I-stat chem 8, ED     Status: Abnormal   Collection Time: 05/09/22  6:13 PM  Result Value Ref Range   Sodium 139 135 - 145 mmol/L   Potassium 3.8 3.5 - 5.1 mmol/L   Chloride 103 98 - 111 mmol/L   BUN 16 6 - 20 mg/dL   Creatinine, Ser 7.79 0.61 - 1.24 mg/dL   Glucose, Bld 390 (H) 70 - 99 mg/dL    Comment: Glucose reference range applies only to samples taken after fasting for at least 8 hours.   Calcium, Ion 1.22 1.15 - 1.40 mmol/L   TCO2 29 22 - 32 mmol/L   Hemoglobin 16.0 13.0 - 17.0 g/dL   HCT 30.0 92.3 - 30.0 %  Basic metabolic panel     Status: Abnormal   Collection Time: 05/10/22  5:03 AM  Result Value Ref Range   Sodium 136 135 - 145 mmol/L   Potassium 3.6 3.5 - 5.1 mmol/L   Chloride 104 98 - 111 mmol/L   CO2 25 22 - 32 mmol/L   Glucose, Bld 128 (H) 70 - 99 mg/dL    Comment: Glucose reference range applies only to samples taken after fasting for at least 8 hours.   BUN 11 6 - 20 mg/dL   Creatinine, Ser 7.62 0.61 - 1.24 mg/dL   Calcium 8.2 (L) 8.9 - 10.3 mg/dL   GFR, Estimated >26 >33 mL/min    Comment: (NOTE) Calculated using the CKD-EPI Creatinine Equation (2021)    Anion  gap 7 5 - 15    Comment: Performed at [facility], [address] [place], [place] [address]  CBC     Status: Abnormal   Collection Time: 05/10/22  5:03 AM  Result Value Ref Range   WBC 13.1 (H) 4.0 - 10.5 K/uL   RBC 4.60 4.22 - 5.81 MIL/uL   Hemoglobin 14.4 13.0 - 17.0 g/dL   HCT 25.6 38.9 - 37.3 %   MCV 93.3 80.0 - 100.0 fL   MCH 31.3 26.0 - 34.0 pg   MCHC 33.6 30.0 - 36.0 g/dL   RDW 42.8 76.8 - 11.5 %   Platelets 353 150 - 400 K/uL   nRBC 0.0 0.0 - 0.2 %    Comment: Performed at [facility], [address], [place], [place] [address]    CT PELVIS W CONTRAST  Result Date: 05/09/2022 CLINICAL DATA:  Anal/rectal abscess. Worsening pain in the perineum. EXAM: CT PELVIS WITH CONTRAST TECHNIQUE: Multidetector CT imaging of the pelvis was performed using the standard protocol following the bolus administration of intravenous contrast. RADIATION DOSE REDUCTION: This exam was performed according to the departmental dose-optimization program which includes automated exposure control, adjustment of the mA and/or kV according to patient size and/or use of iterative reconstruction technique. CONTRAST:  OMNIPAQUE IOHEXOL 300 MG/ML  SOLN COMPARISON:  CT examination dated August 20, 2013. FINDINGS: Urinary Tract:  No abnormality visualized. Bowel: Bowel loops are normal in caliber. Normal appendix. There is a right perianal peripherally enhancing fluid collection measuring at least 5.2 x 2.5 x 7.1 cm with mild surrounding soft tissue swelling. Vascular/Lymphatic: No pathologically enlarged lymph nodes. No significant vascular abnormality seen. Reproductive: Prostate is normal in size with central coarse calcifications. Other:  None. Musculoskeletal: No suspicious bone lesions identified. IMPRESSION: 1. Right perianal peripherally enhancing fluid collection measuring at least 5.2 x 2.5 x 7.1 cm with mild surrounding soft tissue swelling, most consistent with perianal abscess. Surgical  consultation for further management would be helpful. Electronically Signed   By: Larose Hires D.O.   On: 05/09/2022 23:05    Review of Systems  Constitutional:  Positive for fever.  HENT: Negative.    Eyes: Negative.   Respiratory: Negative.    Cardiovascular: Negative.   Gastrointestinal: Negative.   Genitourinary: Negative.   Musculoskeletal: Negative.   Skin: Negative.   Neurological: Negative.   Endo/Heme/Allergies: Negative.   Psychiatric/Behavioral: Negative.      PE Blood pressure 120/83, pulse 80, temperature 98.2 F (36.8 C), resp. rate 16, height 5\' 6"  (1.676 m), weight 104.8 kg, SpO2 95 %. Constitutional: NAD; conversant; no deformities Eyes: Moist conjunctiva; no lid lag; anicteric; PERRL Neck: Trachea midline; no thyromegaly Lungs: Normal respiratory effort; no tactile fremitus CV: RRR; no palpable thrills; no pitting edema GI: Abd soft; no palpable hepatosplenomegaly MSK: Normal gait; no clubbing/cyanosis GU: swelling anterior to anal area with induration Psychiatric: Appropriate affect; alert and oriented x3 Lymphatic: No palpable cervical or axillary lymphadenopathy Skin: No major subcutaneous nodules. Warm and dry   Assessment/Plan: 51 yo male with perirectal abscess -IV abx -pain control -OR for I+D -admit for IV abx and wound care  Discussed procedure details, risks of bleeding, infection, wound issues, recurrence, underlying fistula, and damage to the anal complex. He showed good understanding and wanted to proceed  I reviewed last 24 h vitals and pain scores, last 48 h intake and output, last 24 h labs and trends, and last 24 h imaging results.  This care required high  level of medical decision making.   44 Nija Koopman 05/10/2022, 8:22 AM

## 2022-05-10 NOTE — Transfer of Care (Signed)
Immediate Anesthesia Transfer of Care Note  Patient: Michael [name]  Procedure(s) Performed: IRRIGATION AND DEBRIDEMENT PERIRECTAL ABSCESS (Perineum)  Patient Location: PACU  Anesthesia Type:General  Level of Consciousness: drowsy and patient cooperative  Airway & Oxygen Therapy: Patient Spontanous Breathing and Patient connected to face mask oxygen  Post-op Assessment: Report given to RN and Post -op Vital signs reviewed and stable  Post vital signs: Reviewed and stable  Last Vitals:  Vitals Value Taken Time  BP 124/90 05/10/22 1334  Temp    Pulse 78 05/10/22 1335  Resp 20 05/10/22 1335  SpO2 100 % 05/10/22 1335  Vitals shown include unvalidated device data.  Last Pain:  Vitals:   05/10/22 0919  TempSrc: Oral  PainSc:          Complications: No notable events documented.

## 2022-05-10 NOTE — Anesthesia Procedure Notes (Signed)
Procedure Name: Intubation Date/Time: 05/10/2022 12:46 PM  Performed by: Thelma Comp, CRNAPre-anesthesia Checklist: Patient identified, Emergency Drugs available, Suction available and Patient being monitored Patient Re-evaluated:Patient Re-evaluated prior to induction Oxygen Delivery Method: Circle System Utilized Preoxygenation: Pre-oxygenation with 100% oxygen Induction Type: IV induction Ventilation: Mask ventilation without difficulty and Oral airway inserted - appropriate to patient size Laryngoscope Size: Mac and 4 Grade View: Grade I Tube type: Oral Tube size: 7.5 mm Number of attempts: 1 Airway Equipment and Method: Stylet and Oral airway Placement Confirmation: ETT inserted through vocal cords under direct vision, positive ETCO2 and breath sounds checked- equal and bilateral Secured at: 22 cm Tube secured with: Tape Dental Injury: Teeth and Oropharynx as per pre-operative assessment

## 2022-05-10 NOTE — ED Notes (Signed)
Obtained consent for procedure 

## 2022-05-10 NOTE — Anesthesia Preprocedure Evaluation (Addendum)
Anesthesia Evaluation  Patient identified by MRN, date of birth, ID band Patient awake    Reviewed: Allergy & Precautions, NPO status , Patient's Chart, lab work & pertinent test results  Airway Mallampati: II  TM Distance: >3 FB Neck ROM: Full    Dental  (+) Chipped, Dental Advisory Given,    Pulmonary neg pulmonary ROS, former smoker,    Pulmonary exam normal breath sounds clear to auscultation       Cardiovascular negative cardio ROS Normal cardiovascular exam Rhythm:Regular Rate:Normal  HLD   Neuro/Psych negative neurological ROS  negative psych ROS   GI/Hepatic negative GI ROS, Neg liver ROS,   Endo/Other  negative endocrine ROSMorbid obesity  Renal/GU negative Renal ROS  negative genitourinary   Musculoskeletal negative musculoskeletal ROS (+)   Abdominal   Peds  Hematology negative hematology ROS (+)   Anesthesia Other Findings   Reproductive/Obstetrics                            Anesthesia Physical Anesthesia Plan  ASA: 3  Anesthesia Plan: General   Post-op Pain Management: Tylenol PO (pre-op)*   Induction: Intravenous  PONV Risk Score and Plan: 2 and Ondansetron and Dexamethasone  Airway Management Planned: Oral ETT  Additional Equipment: None  Intra-op Plan:   Post-operative Plan: Extubation in OR  Informed Consent:   Plan Discussed with:   Anesthesia Plan Comments:         Anesthesia Quick Evaluation

## 2022-05-10 NOTE — Progress Notes (Signed)
Pharmacy Antibiotic Note  Michael [name] is a 51 y.o. male admitted on 05/09/2022 with perirectal abscess.  Pharmacy has been consulted for vancomycin and Zosyn dosing.  Plan: Vancomycin 1000mg  given in ED, continue 750mg  IV Q8H. Goal AUC 400-550.  Expected AUC 500. Zosyn 3.375g IV q8h (4 hour infusion).  Height: 5\' 6"  (167.6 cm) Weight: 104.8 kg (231 lb) IBW/kg (Calculated) : 63.8  Temp (24hrs), Avg:98.5 F (36.9 C), Min:98.4 F (36.9 C), Max:98.6 F (37 C)  Recent Labs  Lab 05/09/22 1725 05/09/22 1813  WBC 14.5*  --   CREATININE 0.93 0.80    Estimated Creatinine Clearance: 123.9 mL/min (by C-G formula based on SCr of 0.8 mg/dL).    No Known Allergies   Thank you for allowing pharmacy to be a part of this patient's care.  , PharmD, BCPS  05/10/2022 1:06 AM

## 2022-05-10 NOTE — Op Note (Signed)
Preoperative diagnosis: perirectal abscess  Postoperative diagnosis: same   Procedure: incision and drainage of perirectal abscess  Surgeon: Feliciana Rossetti, M.D.  Anesthesia: general  Indications for procedure: Michael [name] is a 51 y.o. year old male with symptoms of perianal pain and swelling.  Description of procedure: The patient was brought into the operative suite. Anesthesia was administered with General LMA anesthesia. WHO checklist was applied. The patient was then placed in lithotomy position. The area was prepped and draped in the usual sterile fashion.  The area of maximum swelling was incised. Kelly clamp was used to dissect deep to the area and large amount of purulent drainage removed. Cultures were taken. A counter incision was made over the left side and a 1/4" penrose passed between the incisions. The wound was irrigated. Bandage was placed. Patient tolerated procedure well.  Findings: abscess  Specimen: culture of perirectal abscess  Implant: 1/4" penrose   Blood loss: 20 ml  Local anesthesia: none  Complications: none  Feliciana Rossetti, M.D. General, Bariatric, & Minimally Invasive Surgery [facility]

## 2022-05-10 NOTE — ED Notes (Signed)
Report given to short stay RN, pt will go to [facility] for procedure

## 2022-05-11 ENCOUNTER — Encounter (HOSPITAL_COMMUNITY): Payer: Self-pay | Admitting: General Surgery

## 2022-05-11 ENCOUNTER — Other Ambulatory Visit (HOSPITAL_COMMUNITY): Payer: Self-pay

## 2022-05-11 MED ORDER — OXYCODONE HCL 5 MG PO TABS
5.0000 mg | ORAL_TABLET | Freq: Four times a day (QID) | ORAL | 0 refills | Status: DC | PRN
Start: 2022-05-11 — End: 2023-09-26
  Filled 2022-05-11: qty 15, 4d supply, fill #0

## 2022-05-11 MED ORDER — AMOXICILLIN-POT CLAVULANATE 875-125 MG PO TABS
1.0000 | ORAL_TABLET | Freq: Two times a day (BID) | ORAL | 0 refills | Status: DC
Start: 2022-05-11 — End: 2023-09-26
  Filled 2022-05-11: qty 14, 7d supply, fill #0

## 2022-05-11 NOTE — Discharge Summary (Signed)
[facility] Discharge Summary   Patient ID: Michael [name] MRN: [id number] DOB/AGE: 51-Oct-1972 51 y.o.  Admit date: 05/09/2022 Discharge date: 05/11/2022   Discharge Diagnosis Patient Active Problem List   Diagnosis Date Noted   Perirectal abscess 05/10/2022   Prediabetes 07/20/2020   Suspected sleep apnea 07/20/2020   Hemorrhoids, external, thrombosed 05/21/2013    Consultants None  Imaging: CT PELVIS W CONTRAST  Result Date: 05/09/2022 CLINICAL DATA:  Anal/rectal abscess. Worsening pain in the perineum. EXAM: CT PELVIS WITH CONTRAST TECHNIQUE: Multidetector CT imaging of the pelvis was performed using the standard protocol following the bolus administration of intravenous contrast. RADIATION DOSE REDUCTION: This exam was performed according to the departmental dose-optimization program which includes automated exposure control, adjustment of the mA and/or kV according to patient size and/or use of iterative reconstruction technique. CONTRAST:  OMNIPAQUE IOHEXOL 300 MG/ML  SOLN COMPARISON:  CT examination dated August 20, 2013. FINDINGS: Urinary Tract:  No abnormality visualized. Bowel: Bowel loops are normal in caliber. Normal appendix. There is a right perianal peripherally enhancing fluid collection measuring at least 5.2 x 2.5 x 7.1 cm with mild surrounding soft tissue swelling. Vascular/Lymphatic: No pathologically enlarged lymph nodes. No significant vascular abnormality seen. Reproductive: Prostate is normal in size with central coarse calcifications. Other:  None. Musculoskeletal: No suspicious bone lesions identified. IMPRESSION: 1. Right perianal peripherally enhancing fluid collection measuring at least 5.2 x 2.5 x 7.1 cm with mild surrounding soft tissue swelling, most consistent with perianal abscess. Surgical consultation for further management would be helpful. Electronically Signed   By: Larose Hires D.O.   On: 05/09/2022 23:05    Procedures Dr.  Sheliah Hatch (05/10/2022) - incision and drainage of perirectal abscess  Hospital Course:  Michael [name] is a 51 y.o. male who presented to [facility] 05/09/22 with 4 days of perianal pain.  Workup showed perirectal abscess.  Patient was admitted and underwent procedure listed above.  Tolerated procedure well and was transferred to the floor.  He was kept on broad spectrum IV antibiotics during admission. Cultures pending at time of discharge and he was discharged home with 7 days of Augmentin. On POD1, the patient was voiding well, tolerating diet, ambulating well, pain well controlled, vital signs stable, incisions/penrose drain in place and felt stable for discharge home.  Patient will follow up as below and knows to call with questions or concerns.    I have personally reviewed the patients medication history on the [place] controlled substance database.    Physical Exam: General:  Alert, NAD, pleasant, comfortable Pulm: rate and effort normal Abd:  Soft, ND, NT GU: perirectal abscess s/p I&D with looped penrose drain in place, mild induration, no significant cellulitis or purulent drainage  Allergies as of 05/11/2022   No Known Allergies      Medication List     STOP taking these medications    fluticasone 50 MCG/ACT nasal spray Commonly known as: FLONASE   hydrocortisone 2.5 % rectal cream Commonly known as: ANUSOL-HC   neomycin-polymyxin-hydrocortisone 3.5-10000-1 OTIC suspension Commonly known as: CORTISPORIN   nystatin cream Commonly known as: MYCOSTATIN       TAKE these medications    acetaminophen 500 MG tablet Commonly known as: TYLENOL Take 1,000 mg by mouth 3 (three) times daily as needed (pain).   amoxicillin-clavulanate 875-125 MG tablet Commonly known as: AUGMENTIN Take 1 tablet by mouth 2 (two) times daily.   ibuprofen 800 MG tablet Commonly known as: ADVIL Take 1 tablet (800 mg  total) by mouth 3 (three) times daily. What changed:  how much to take when to  take this reasons to take this Another medication with the same name was removed. Continue taking this medication, and follow the directions you see here.   oxyCODONE 5 MG immediate release tablet Commonly known as: Oxy IR/ROXICODONE Take 1 tablet (5 mg total) by mouth every 6 (six) hours as needed for severe pain.   polyethylene glycol powder 17 GM/SCOOP powder Commonly known as: GLYCOLAX/MIRALAX Take 17 g by mouth daily. What changed:  when to take this reasons to take this          Follow-up Information     Maczis, Hedda Slade, PA-C Follow up on 06/02/2022.   Specialty: General Surgery Why: 1:45 pm, Arrive 30 minutes prior to your appointment time, Please bring your insurance card and photo ID Contact information: [address] [facility] [place] [address] [phone]         [facility]. Call.   Why: Call to establish primary care physician, discuss elevated blood sugar Contact information: [address] [employer] [address] [place] [address] [phone]                 Signed: Franne Forts, [facility] 05/11/2022, 10:16 AM Please see Amion for pager number during day hours 7:00am-4:30pm

## 2022-05-11 NOTE — Plan of Care (Signed)
Dressing changed last night, penrose drain intact.  Problem: Activity: Goal: Risk for activity intolerance will decrease 05/11/2022 0542 by Orland Penman, RN Outcome: Progressing 05/11/2022 0541 by Orland Penman, RN Outcome: Progressing   Problem: Pain Managment: Goal: General experience of comfort will improve 05/11/2022 0542 by Orland Penman, RN Outcome: Progressing 05/11/2022 0541 by Orland Penman, RN Outcome: Progressing

## 2022-05-11 NOTE — Anesthesia Postprocedure Evaluation (Signed)
Anesthesia Post Note  Patient: Michael [name]  Procedure(s) Performed: IRRIGATION AND DEBRIDEMENT PERIRECTAL ABSCESS (Perineum)     Patient location during evaluation: PACU Anesthesia Type: General Level of consciousness: awake and alert Pain management: pain level controlled Vital Signs Assessment: post-procedure vital signs reviewed and stable Respiratory status: spontaneous breathing, nonlabored ventilation, respiratory function stable and patient connected to nasal cannula oxygen Cardiovascular status: blood pressure returned to baseline and stable Postop Assessment: no apparent nausea or vomiting Anesthetic complications: no   No notable events documented.  Last Vitals:  Vitals:   05/10/22 1945 05/11/22 0655  BP: 136/85 121/69  Pulse: 92 80  Resp: 18 18  Temp:  36.8 C  SpO2: 94% 97%    Last Pain:  Vitals:   05/11/22 0655  TempSrc: Oral  PainSc:    Pain Goal: Patients Stated Pain Goal: 0 (05/10/22 2125)                 Muaaz Brau L Eino Whitner

## 2022-05-11 NOTE — Care Management (Signed)
Follow up instructions on AVS, meds sent to [facility] pharmacy and MATCH completed with override for pain meds due to him being a surgical patient. No other TOC needs for DC identified.

## 2022-05-13 LAB — AEROBIC CULTURE W GRAM STAIN (SUPERFICIAL SPECIMEN)

## 2022-05-15 LAB — ANAEROBIC CULTURE W GRAM STAIN

## 2022-05-25 ENCOUNTER — Ambulatory Visit (HOSPITAL_COMMUNITY): Admission: EM | Admit: 2022-05-25 | Discharge: 2022-05-25 | Discharge status: Absent without leave | Payer: Self-pay

## 2022-05-25 ENCOUNTER — Encounter (HOSPITAL_COMMUNITY): Payer: Self-pay | Admitting: Emergency Medicine

## 2022-05-25 NOTE — ED Notes (Signed)
Pt at wrong place. [facility] is waiting for pt.

## 2022-05-25 NOTE — ED Triage Notes (Signed)
Reports painful/swollen suture site in perineal area, were placed 2 weeks ago in the ed. Denies drainage from site, fever. Reports a lot of pain with sitting.

## 2023-05-23 ENCOUNTER — Emergency Department (HOSPITAL_COMMUNITY): Payer: Self-pay

## 2023-05-23 ENCOUNTER — Encounter (HOSPITAL_COMMUNITY): Payer: Self-pay

## 2023-05-23 ENCOUNTER — Emergency Department (HOSPITAL_COMMUNITY)
Admission: EM | Admit: 2023-05-23 | Discharge: 2023-05-24 | Disposition: A | Discharge status: Home / Self Care | Payer: Self-pay | Attending: Emergency Medicine | Admitting: Emergency Medicine

## 2023-05-23 DIAGNOSIS — M25561 Pain in right knee: Secondary | ICD-10-CM

## 2023-05-23 HISTORY — DX: Pain in unspecified knee: M25.569

## 2023-05-23 HISTORY — DX: Essential (primary) hypertension: I10

## 2023-05-23 NOTE — ED Triage Notes (Signed)
Pt arrived POV for right knee pain, pt reports hx of right knee injury 16 years ago. Pt reports no new injury, but the knee pain is starting to return, reports painful to bear weight to right knee, difficulty ambulating. Pt reports pain worse w/rotation. Slight swelling to medial right knee, no heat or redness noted.

## 2023-05-24 MED ORDER — MELOXICAM 7.5 MG PO TABS: 15.0000 mg | ORAL_TABLET | Freq: Every day | ORAL | 0 refills | Status: DC

## 2023-05-24 NOTE — Discharge Instructions (Signed)
Take the prescribed medication as directed.  Can continue taking tylenol with this if you like but would avoid motrin/aleve/ibuprofen. Follow-up with orthopedics-- call in the morning to get appt scheduled. Return to the ED for new or worsening symptoms.

## 2023-05-24 NOTE — ED Provider Notes (Signed)
[place] EMERGENCY DEPARTMENT AT [facility] Provider Note   CSN: [id number] Arrival date & time: 05/23/23  2314     History  Chief Complaint  Patient presents with   Knee Pain    Michael [name] is a 52 y.o. male.  The history is provided by the patient and medical records.  Knee Pain  36 old male presenting to the ED with right knee pain.  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  States he has been doing okay recently up until about the last week when he has had recurrence of pain along the medial knee.  He denies any new injury, trauma, or fall.  Pain is worse with weightbearing, ambulation, or rotating his leg.  He denies any fever or chills.  He has not had any follow-up about his knee in the past several years.  He did take Tylenol which gave him some relief.  Home Medications Prior to Admission medications   Medication Sig Start Date End Date Taking? Authorizing Provider  acetaminophen (TYLENOL) 500 MG tablet Take 1,000 mg by mouth 3 (three) times daily as needed (pain).    [provider]  amoxicillin-clavulanate (AUGMENTIN) 875-125 MG tablet Take 1 tablet by mouth 2 (two) times daily. 05/11/22   Meuth, Brooke A, PA-C  ibuprofen (ADVIL,MOTRIN) 800 MG tablet Take 1 tablet (800 mg total) by mouth 3 (three) times daily. Patient taking differently: Take 1,600 mg by mouth 2 (two) times daily as needed (pain). 05/22/18   Wieters, Hallie C, PA-C  oxyCODONE (OXY IR/ROXICODONE) 5 MG immediate release tablet Take 1 tablet (5 mg total) by mouth every 6 (six) hours as needed for severe pain. 05/11/22   Meuth, Brooke A, PA-C  polyethylene glycol powder (GLYCOLAX/MIRALAX) 17 GM/SCOOP powder Take 17 g by mouth daily. Patient taking differently: Take 17 g by mouth daily as needed for mild constipation. 07/07/20   Darr, Gerilyn Pilgrim, PA-C      Allergies    Patient has no known allergies.    Review of Systems   Review of  Systems  Musculoskeletal:  Positive for arthralgias.  All other systems reviewed and are negative.   Physical Exam Updated Vital Signs BP (!) 151/99 (BP Location: Left Arm)   Pulse 81   Temp 98.5 F (36.9 C) (Oral)   Resp 20   Ht 5\' 6"  (1.676 m)   Wt 99.8 kg   SpO2 96%   BMI 35.51 kg/m   Physical Exam Vitals and nursing note reviewed.  Constitutional:      Appearance: He is well-developed.  HENT:     Head: Normocephalic and atraumatic.  Eyes:     Conjunctiva/sclera: Conjunctivae normal.     Pupils: Pupils are equal, round, and reactive to light.  Cardiovascular:     Rate and Rhythm: Normal rate and regular rhythm.     Heart sounds: Normal heart sounds.  Pulmonary:     Effort: Pulmonary effort is normal.     Breath sounds: Normal breath sounds.  Abdominal:     General: Bowel sounds are normal.     Palpations: Abdomen is soft.  Musculoskeletal:        General: Normal range of motion.     Cervical back: Normal range of motion.     Comments: Right knee without acute deformity, there is minimal swelling present, no appreciable large effusion, tenderness along medial joint line, no erythema/induration, no warmth to  touch, leg is NVI  Skin:    General: Skin is warm and dry.  Neurological:     Mental Status: He is alert and oriented to person, place, and time.     ED Results / Procedures / Treatments   Labs (all labs ordered are listed, but only abnormal results are displayed) Labs Reviewed - No data to display  EKG None  Radiology DG Knee Complete 4 Views Right  Result Date: 05/23/2023 CLINICAL DATA:  Right knee pain EXAM: RIGHT KNEE - COMPLETE 4+ VIEW COMPARISON:  None Available. FINDINGS: No fracture or dislocation is seen. Very mild degenerative changes in the lateral and patellofemoral compartments. Small suprapatellar knee joint effusion. IMPRESSION: Mild degenerative changes with small suprapatellar knee joint effusion. Electronically Signed   By: Charline Bills M.D.   On: 05/23/2023 23:44    Procedures Procedures    Medications Ordered in ED Medications - No data to display  ED Course/ Medical Decision Making/ A&P                                 Medical Decision Making Amount and/or Complexity of Data Reviewed Radiology: ordered and independent interpretation performed.  Risk Prescription drug management.   52 year old male here with right knee pain.  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  Knee is overall normal in appearance on exam, there is scant amount of swelling and tenderness along the medial joint line.  No gross bony deformity or significant effusion present.  No erythema or induration, no warmth to touch.  X-ray with degenerative changes and small suprapatellar joint effusion.  Exam findings not consistent with septic joint.  No fever or other red flag symptoms.  Patient was placed in knee sleeve, will start meloxicam (most recent SrCr was WNL). Advised can continue tylenol with this but recommended to avoid further NSAIDs simultaneously.  Will refer to orthopedics for follow-up.  Can return here for any new/acute changes.  Final Clinical Impression(s) / ED Diagnoses Final diagnoses:  Acute pain of right knee    Rx / DC Orders ED Discharge Orders          Ordered    meloxicam (MOBIC) 7.5 MG tablet  Daily        05/24/23 0217              Garlon Hatchet, PA-C 05/24/23 0249    Dione Booze, MD 05/24/23 [phone]

## 2023-09-25 ENCOUNTER — Encounter (HOSPITAL_COMMUNITY): Payer: Self-pay | Admitting: Emergency Medicine

## 2023-09-25 ENCOUNTER — Observation Stay (HOSPITAL_COMMUNITY)
Admission: EM | Admit: 2023-09-25 | Discharge: 2023-09-27 | Disposition: A | Discharge status: Home / Self Care | Payer: MEDICAID | Attending: Surgery | Admitting: Surgery

## 2023-09-25 ENCOUNTER — Other Ambulatory Visit: Payer: Self-pay

## 2023-09-25 DIAGNOSIS — Z87891 Personal history of nicotine dependence: Secondary | ICD-10-CM | POA: Insufficient documentation

## 2023-09-25 DIAGNOSIS — E669 Obesity, unspecified: Secondary | ICD-10-CM | POA: Insufficient documentation

## 2023-09-25 DIAGNOSIS — Z79899 Other long term (current) drug therapy: Secondary | ICD-10-CM | POA: Insufficient documentation

## 2023-09-25 DIAGNOSIS — K610 Anal abscess: Principal | ICD-10-CM | POA: Insufficient documentation

## 2023-09-25 DIAGNOSIS — I10 Essential (primary) hypertension: Secondary | ICD-10-CM | POA: Diagnosis not present

## 2023-09-25 DIAGNOSIS — E119 Type 2 diabetes mellitus without complications: Secondary | ICD-10-CM

## 2023-09-25 DIAGNOSIS — L02215 Cutaneous abscess of perineum: Secondary | ICD-10-CM | POA: Diagnosis present

## 2023-09-25 LAB — CBC WITH DIFFERENTIAL/PLATELET
Abs Immature Granulocytes: 0.06 10*3/uL (ref 0.00–0.07)
Basophils Absolute: 0.1 10*3/uL (ref 0.0–0.1)
Basophils Relative: 1 %
Eosinophils Absolute: 0.5 10*3/uL (ref 0.0–0.5)
Eosinophils Relative: 4 %
HCT: 45.5 % (ref 39.0–52.0)
Hemoglobin: 14.8 g/dL (ref 13.0–17.0)
Immature Granulocytes: 1 %
Lymphocytes Relative: 16 %
Lymphs Abs: 1.9 10*3/uL (ref 0.7–4.0)
MCH: 30.7 pg (ref 26.0–34.0)
MCHC: 32.5 g/dL (ref 30.0–36.0)
MCV: 94.4 fL (ref 80.0–100.0)
Monocytes Absolute: 1.2 10*3/uL — ABNORMAL HIGH (ref 0.1–1.0)
Monocytes Relative: 10 %
Neutro Abs: 8.6 10*3/uL — ABNORMAL HIGH (ref 1.7–7.7)
Neutrophils Relative %: 68 %
Platelets: 280 10*3/uL (ref 150–400)
RBC: 4.82 MIL/uL (ref 4.22–5.81)
RDW: 11.9 % (ref 11.5–15.5)
WBC: 12.3 10*3/uL — ABNORMAL HIGH (ref 4.0–10.5)
nRBC: 0 % (ref 0.0–0.2)

## 2023-09-25 LAB — URINALYSIS, ROUTINE W REFLEX MICROSCOPIC
Bacteria, UA: NONE SEEN
Bilirubin Urine: NEGATIVE
Glucose, UA: 150 mg/dL — AB
Hgb urine dipstick: NEGATIVE
Ketones, ur: NEGATIVE mg/dL
Leukocytes,Ua: NEGATIVE
Nitrite: NEGATIVE
Protein, ur: 30 mg/dL — AB
Specific Gravity, Urine: 1.031 — ABNORMAL HIGH (ref 1.005–1.030)
pH: 5 (ref 5.0–8.0)

## 2023-09-25 LAB — COMPREHENSIVE METABOLIC PANEL
ALT: 34 U/L (ref 0–44)
AST: 18 U/L (ref 15–41)
Albumin: 3.1 g/dL — ABNORMAL LOW (ref 3.5–5.0)
Alkaline Phosphatase: 115 U/L (ref 38–126)
Anion gap: 10 (ref 5–15)
BUN: 15 mg/dL (ref 6–20)
CO2: 23 mmol/L (ref 22–32)
Calcium: 8.8 mg/dL — ABNORMAL LOW (ref 8.9–10.3)
Chloride: 106 mmol/L (ref 98–111)
Creatinine, Ser: 0.91 mg/dL (ref 0.61–1.24)
GFR, Estimated: >60 mL/min (ref >60)
Glucose, Bld: 199 mg/dL — ABNORMAL HIGH (ref 70–99)
Potassium: 3.8 mmol/L (ref 3.5–5.1)
Sodium: 139 mmol/L (ref 135–145)
Total Bilirubin: 0.7 mg/dL (ref <1.2)
Total Protein: 6.5 g/dL (ref 6.5–8.1)

## 2023-09-25 LAB — LIPASE, BLOOD: Lipase: 36 U/L (ref 11–51)

## 2023-09-25 NOTE — ED Notes (Signed)
Pt states he takes ibuprofen at home to relieve pain. Pt states he takes two 800 mg but draining is what helped it last time.

## 2023-09-25 NOTE — ED Triage Notes (Signed)
Pt here for perianal abscess. States that he has been seen for the same and had it drained and now it has come back. Reports pain with sitting.

## 2023-09-25 NOTE — ED Provider Triage Note (Signed)
Emergency Medicine Provider Triage Evaluation Note  Michael [name] , a 52 y.o. male  was evaluated in triage. Pt complains of abscess. Has had perianal pain for the past week. States he had an abscess there in July that required draining. Also endorsing subjective fever but no n/v/d.   Review of Systems  Positive: See above Negative: See above  Physical Exam  BP (!) 134/95 (BP Location: Right Arm)   Pulse 98   Temp 98.6 F (37 C) (Oral)   Resp 20   Ht 5\' 6"  (1.676 m)   Wt 105.2 kg   SpO2 96%   BMI 37.45 kg/m  Gen:   Awake, no distress   Resp:  Normal effort  MSK:   Moves extremities without difficulty  Other:    Medical Decision Making  Medically screening exam initiated at 6:30 PM.  Appropriate orders placed.  Michael [name] was informed that the remainder of the evaluation will be completed by another provider, this initial triage assessment does not replace that evaluation, and the importance of remaining in the ED until their evaluation is complete.  Work up started   Gareth Eagle, PA-C 09/25/23 1836

## 2023-09-26 ENCOUNTER — Emergency Department (HOSPITAL_COMMUNITY): Payer: Self-pay

## 2023-09-26 ENCOUNTER — Observation Stay (HOSPITAL_BASED_OUTPATIENT_CLINIC_OR_DEPARTMENT_OTHER): Payer: MEDICAID | Admitting: Certified Registered Nurse Anesthetist

## 2023-09-26 ENCOUNTER — Encounter (HOSPITAL_COMMUNITY)
Admission: EM | Disposition: A | Discharge status: Home / Self Care | Payer: Self-pay | Source: Home / Self Care | Attending: Emergency Medicine

## 2023-09-26 ENCOUNTER — Other Ambulatory Visit: Payer: Self-pay

## 2023-09-26 ENCOUNTER — Encounter (HOSPITAL_COMMUNITY): Payer: Self-pay

## 2023-09-26 ENCOUNTER — Observation Stay (HOSPITAL_COMMUNITY): Payer: MEDICAID | Admitting: Certified Registered Nurse Anesthetist

## 2023-09-26 DIAGNOSIS — L02818 Cutaneous abscess of other sites: Secondary | ICD-10-CM

## 2023-09-26 DIAGNOSIS — L02215 Cutaneous abscess of perineum: Secondary | ICD-10-CM | POA: Diagnosis present

## 2023-09-26 HISTORY — PX: INCISION AND DRAINAGE PERIRECTAL ABSCESS: SHX1804

## 2023-09-26 LAB — HEMOGLOBIN A1C
Hgb A1c MFr Bld: 7.4 % — ABNORMAL HIGH (ref 4.8–5.6)
Mean Plasma Glucose: 165.68 mg/dL

## 2023-09-26 LAB — HIV ANTIBODY (ROUTINE TESTING W REFLEX): HIV Screen 4th Generation wRfx: NONREACTIVE

## 2023-09-26 SURGERY — INCISION AND DRAINAGE, ABSCESS, PERIRECTAL
Anesthesia: General

## 2023-09-26 MED ORDER — PHENYLEPHRINE HCL (PRESSORS) 10 MG/ML IV SOLN
INTRAVENOUS | Status: DC | PRN
  Administered 2023-09-26: 80 ug via INTRAVENOUS

## 2023-09-26 MED ORDER — LIDOCAINE HCL (CARDIAC) PF 100 MG/5ML IV SOSY
PREFILLED_SYRINGE | INTRAVENOUS | Status: DC | PRN
  Administered 2023-09-26: 60 mg via INTRAVENOUS

## 2023-09-26 MED ORDER — HYDROMORPHONE HCL 1 MG/ML IJ SOLN: 1.0000 mg | INTRAMUSCULAR | Status: DC | PRN

## 2023-09-26 MED ORDER — SODIUM CHLORIDE 0.9 % IV BOLUS
1000.0000 mL | Freq: Once | INTRAVENOUS | Status: AC
  Administered 2023-09-26: 1000 mL via INTRAVENOUS

## 2023-09-26 MED ORDER — ACETAMINOPHEN 325 MG PO TABS: 650.0000 mg | ORAL_TABLET | Freq: Four times a day (QID) | ORAL | Status: DC | PRN

## 2023-09-26 MED ORDER — DEXAMETHASONE SODIUM PHOSPHATE 10 MG/ML IJ SOLN
INTRAMUSCULAR | Status: DC | PRN
  Administered 2023-09-26: 10 mg via INTRAVENOUS

## 2023-09-26 MED ORDER — OXYCODONE HCL 5 MG PO TABS: 5.0000 mg | ORAL_TABLET | Freq: Once | ORAL | Status: DC | PRN

## 2023-09-26 MED ORDER — PROCHLORPERAZINE EDISYLATE 10 MG/2ML IJ SOLN: 5.0000 mg | Freq: Four times a day (QID) | INTRAMUSCULAR | Status: DC | PRN

## 2023-09-26 MED ORDER — METHOCARBAMOL 1000 MG/10ML IJ SOLN: 500.0000 mg | Freq: Three times a day (TID) | INTRAMUSCULAR | Status: DC | PRN

## 2023-09-26 MED ORDER — HYDROMORPHONE HCL 1 MG/ML IJ SOLN
0.5000 mg | Freq: Once | INTRAMUSCULAR | Status: AC
Start: 2023-09-26 — End: 2023-09-26
  Administered 2023-09-26: 0.5 mg via INTRAVENOUS
  Filled 2023-09-26: qty 1

## 2023-09-26 MED ORDER — LACTATED RINGERS IV SOLN: INTRAVENOUS | Status: DC | PRN

## 2023-09-26 MED ORDER — LACTATED RINGERS IV SOLN: INTRAVENOUS | Status: AC

## 2023-09-26 MED ORDER — DIPHENHYDRAMINE HCL 25 MG PO CAPS: 25.0000 mg | ORAL_CAPSULE | Freq: Four times a day (QID) | ORAL | Status: DC | PRN

## 2023-09-26 MED ORDER — OXYCODONE HCL 5 MG PO TABS
5.0000 mg | ORAL_TABLET | ORAL | Status: DC | PRN
  Administered 2023-09-27 (×2): 10 mg via ORAL
  Filled 2023-09-26 (×2): qty 2

## 2023-09-26 MED ORDER — PROPOFOL 10 MG/ML IV BOLUS
INTRAVENOUS | Status: DC | PRN
  Administered 2023-09-26: 200 mg via INTRAVENOUS

## 2023-09-26 MED ORDER — METHOCARBAMOL 500 MG PO TABS: 500.0000 mg | ORAL_TABLET | Freq: Three times a day (TID) | ORAL | Status: DC | PRN

## 2023-09-26 MED ORDER — FENTANYL CITRATE (PF) 100 MCG/2ML IJ SOLN
INTRAMUSCULAR | Status: DC | PRN
  Administered 2023-09-26 (×2): 50 ug via INTRAVENOUS

## 2023-09-26 MED ORDER — ONDANSETRON HCL 4 MG/2ML IJ SOLN
INTRAMUSCULAR | Status: DC | PRN
  Administered 2023-09-26: 4 mg via INTRAVENOUS

## 2023-09-26 MED ORDER — POLYETHYLENE GLYCOL 3350 17 G PO PACK
17.0000 g | PACK | Freq: Every day | ORAL | Status: DC | PRN
Start: 2023-09-26 — End: 2023-09-27

## 2023-09-26 MED ORDER — SUCCINYLCHOLINE CHLORIDE 200 MG/10ML IV SOSY
PREFILLED_SYRINGE | INTRAVENOUS | Status: AC
  Filled 2023-09-26: qty 10

## 2023-09-26 MED ORDER — PROPOFOL 10 MG/ML IV BOLUS
INTRAVENOUS | Status: AC
  Filled 2023-09-26: qty 20

## 2023-09-26 MED ORDER — ACETAMINOPHEN 650 MG RE SUPP: 650.0000 mg | Freq: Four times a day (QID) | RECTAL | Status: DC | PRN

## 2023-09-26 MED ORDER — PIPERACILLIN-TAZOBACTAM 3.375 G IVPB 30 MIN
3.3750 g | Freq: Once | INTRAVENOUS | Status: AC
  Administered 2023-09-26: 3.375 g via INTRAVENOUS
  Filled 2023-09-26: qty 50

## 2023-09-26 MED ORDER — MORPHINE SULFATE (PF) 2 MG/ML IV SOLN
2.0000 mg | INTRAVENOUS | Status: DC | PRN
  Administered 2023-09-26: 2 mg via INTRAVENOUS
  Filled 2023-09-26: qty 1

## 2023-09-26 MED ORDER — 0.9 % SODIUM CHLORIDE (POUR BTL) OPTIME
TOPICAL | Status: DC | PRN
  Administered 2023-09-26: 200 mL

## 2023-09-26 MED ORDER — IOHEXOL 350 MG/ML SOLN
75.0000 mL | Freq: Once | INTRAVENOUS | Status: AC | PRN
  Administered 2023-09-26: 75 mL via INTRAVENOUS

## 2023-09-26 MED ORDER — ACETAMINOPHEN 500 MG PO TABS
1000.0000 mg | ORAL_TABLET | Freq: Three times a day (TID) | ORAL | Status: DC
  Administered 2023-09-26 – 2023-09-27 (×3): 1000 mg via ORAL
  Filled 2023-09-26 (×4): qty 2

## 2023-09-26 MED ORDER — SIMETHICONE 80 MG PO CHEW: 40.0000 mg | CHEWABLE_TABLET | Freq: Four times a day (QID) | ORAL | Status: DC | PRN

## 2023-09-26 MED ORDER — MIDAZOLAM HCL 2 MG/2ML IJ SOLN
INTRAMUSCULAR | Status: AC
  Filled 2023-09-26: qty 2

## 2023-09-26 MED ORDER — LIDOCAINE HCL (PF) 1 % IJ SOLN
30.0000 mL | Freq: Once | INTRAMUSCULAR | Status: AC
  Administered 2023-09-26: 30 mL via INTRADERMAL
  Filled 2023-09-26: qty 30

## 2023-09-26 MED ORDER — ONDANSETRON HCL 4 MG/2ML IJ SOLN: 4.0000 mg | Freq: Four times a day (QID) | INTRAMUSCULAR | Status: DC | PRN

## 2023-09-26 MED ORDER — OXYCODONE HCL 5 MG/5ML PO SOLN: 5.0000 mg | Freq: Once | ORAL | Status: DC | PRN

## 2023-09-26 MED ORDER — ACETAMINOPHEN 10 MG/ML IV SOLN: 1000.0000 mg | Freq: Once | INTRAVENOUS | Status: DC | PRN

## 2023-09-26 MED ORDER — FENTANYL CITRATE (PF) 250 MCG/5ML IJ SOLN
INTRAMUSCULAR | Status: AC
  Filled 2023-09-26: qty 5

## 2023-09-26 MED ORDER — AMISULPRIDE (ANTIEMETIC) 5 MG/2ML IV SOLN
10.0000 mg | Freq: Once | INTRAVENOUS | Status: DC | PRN
  Filled 2023-09-26: qty 4

## 2023-09-26 MED ORDER — ENOXAPARIN SODIUM 40 MG/0.4ML IJ SOSY
40.0000 mg | PREFILLED_SYRINGE | INTRAMUSCULAR | Status: DC
  Administered 2023-09-27: 40 mg via SUBCUTANEOUS
  Filled 2023-09-26: qty 0.4

## 2023-09-26 MED ORDER — LIDOCAINE 2% (20 MG/ML) 5 ML SYRINGE
INTRAMUSCULAR | Status: AC
  Filled 2023-09-26: qty 5

## 2023-09-26 MED ORDER — SUGAMMADEX SODIUM 200 MG/2ML IV SOLN
INTRAVENOUS | Status: DC | PRN
  Administered 2023-09-26: 300 mg via INTRAVENOUS

## 2023-09-26 MED ORDER — HYDRALAZINE HCL 20 MG/ML IJ SOLN: 10.0000 mg | INTRAMUSCULAR | Status: DC | PRN

## 2023-09-26 MED ORDER — ONDANSETRON 4 MG PO TBDP: 4.0000 mg | ORAL_TABLET | Freq: Four times a day (QID) | ORAL | Status: DC | PRN

## 2023-09-26 MED ORDER — PROCHLORPERAZINE MALEATE 10 MG PO TABS: 10.0000 mg | ORAL_TABLET | Freq: Four times a day (QID) | ORAL | Status: DC | PRN

## 2023-09-26 MED ORDER — FENTANYL CITRATE PF 50 MCG/ML IJ SOSY: 25.0000 ug | PREFILLED_SYRINGE | INTRAMUSCULAR | Status: DC | PRN

## 2023-09-26 MED ORDER — MIDAZOLAM HCL 5 MG/5ML IJ SOLN
INTRAMUSCULAR | Status: DC | PRN
  Administered 2023-09-26: 2 mg via INTRAVENOUS

## 2023-09-26 MED ORDER — DIPHENHYDRAMINE HCL 50 MG/ML IJ SOLN: 25.0000 mg | Freq: Four times a day (QID) | INTRAMUSCULAR | Status: DC | PRN

## 2023-09-26 MED ORDER — ROCURONIUM BROMIDE 100 MG/10ML IV SOLN
INTRAVENOUS | Status: DC | PRN
  Administered 2023-09-26: 40 mg via INTRAVENOUS

## 2023-09-26 MED ORDER — ONDANSETRON HCL 4 MG/2ML IJ SOLN
INTRAMUSCULAR | Status: AC
  Filled 2023-09-26: qty 2

## 2023-09-26 MED ORDER — KETOROLAC TROMETHAMINE 30 MG/ML IJ SOLN: 30.0000 mg | Freq: Once | INTRAMUSCULAR | Status: DC | PRN

## 2023-09-26 MED ORDER — HYDROMORPHONE HCL 1 MG/ML IJ SOLN
0.5000 mg | Freq: Once | INTRAMUSCULAR | Status: AC
  Administered 2023-09-26: 0.5 mg via INTRAVENOUS
  Filled 2023-09-26: qty 1

## 2023-09-26 SURGICAL SUPPLY — 16 items
COVER SURGICAL LIGHT HANDLE (MISCELLANEOUS) ×1 IMPLANT
DRAIN PENROSE 0.5X18 (DRAIN) IMPLANT
ELECT REM PT RETURN 9FT ADLT (ELECTROSURGICAL) ×1 IMPLANT
ELECTRODE REM PT RTRN 9FT ADLT (ELECTROSURGICAL) ×1 IMPLANT
GLOVE BIO SURGEON STRL SZ7 (GLOVE) ×1 IMPLANT
GLOVE BIOGEL PI IND STRL 7.5 (GLOVE) ×1 IMPLANT
GOWN STRL REUS W/ TWL XL LVL3 (GOWN DISPOSABLE) IMPLANT
KIT BASIN OR (CUSTOM PROCEDURE TRAY) ×1 IMPLANT
KIT TURNOVER KIT B (KITS) ×1 IMPLANT
NS IRRIG 1000ML POUR BTL (IV SOLUTION) ×1 IMPLANT
PACK GENERAL/GYN (CUSTOM PROCEDURE TRAY) IMPLANT
PACK LITHOTOMY IV (CUSTOM PROCEDURE TRAY) IMPLANT
PAD ARMBOARD 7.5X6 YLW CONV (MISCELLANEOUS) ×1 IMPLANT
SURGILUBE 2OZ TUBE FLIPTOP (MISCELLANEOUS) ×1 IMPLANT
SUT ETHILON 2 0 FS 18 (SUTURE) IMPLANT
TOWEL GREEN STERILE (TOWEL DISPOSABLE) ×1 IMPLANT

## 2023-09-26 NOTE — Op Note (Signed)
Patient: Michael [name] (1971/09/12, [id number])  Date of Surgery: 09/26/2023  Preoperative Diagnosis: perineal abscess   Postoperative Diagnosis: perineal abscess   Surgical Procedure: IRRIGATION AND DEBRIDEMENT PERINEAL  ABSCESS:  EXAM UNDER ANESTHESIA: [id number]   Operative Team Members:  Surgeons and Role:    * Soloman Mckeithan, Hyman Hopes, MD - Primary   Anesthesiologist: Leonides Grills, MD CRNA: Reine Just, CRNA   Anesthesia: General   Fluids:  Total I/O In: 900 [I.V.:900] Out: 5 [Blood:5]  Complications: None  Drains:   Half-inch Penrose drain    Specimen:  ID Type Source Tests Collected by Time Destination  A : Peri-rectal Abscess Fluid Body Fluid PATH Cytology Misc. fluid AEROBIC/ANAEROBIC CULTURE W GRAM STAIN (SURGICAL/DEEP WOUND) Olliver Boyadjian, Hyman Hopes, MD 09/26/2023 2108      Disposition:  PACU - hemodynamically stable.  Plan of Care:  Continue inpatient care    Indications for Procedure: Michael [name] is a 52 y.o. male who presented with recurrent perianal abscess.  I recommended exam under anesthesia with incision and drainage of perianal abscess..  The procedure itself as well as its risks, benefits and alternatives were discussed.  The risks discussed included but were not limited to the risk of infection, bleeding, damage to nearby structures, and recurrent abscess.  After a full discussion and all questions answered the patient granted consent to proceed.  Findings: Deep abscess cavity with purulent drainage at the anterior or 12 o'clock position treated with a cruciate incision, and a half-inch Penrose drain.  Small abscess cavity at the right or 9 o'clock position from the anus treated with a cruciate incision and packing   Description of Procedure:   On the date stated above the patient taken operating room suite and placed in lithotomy position after general endotracheal anesthesia was induced.  The patient's perianal region was prepped  and draped in usual sterile fashion.  A timeout was completed verifying the correct patient, procedure, positioning, and equipment needed for the case.  I made a cruciate incision in the location of the previous linear incision anterior to the anus in 12 o'clock position in the area of obvious abscess.  I probed the incision with a Tresa Endo and entered a large abscess cavity.  I broke up the loculations of the cavity with the [facility] and palpated the cavity with my finger.  Hemostasis obtained with packing and electrocautery.  The packing was removed and 1/2 inch Penrose drain was inserted into the depth of the abscess cavity and sutured to the skin using nylon suture.  A digital rectal exam was performed without clear abnormalities.  A superficial perineal abscess with very small abscess cavity was identified to the right of the anus at the 9 o'clock position.  This was treated with a cruciate incision and a small amount of purulent drainage was obtained.  There were was not a large underlying cavity and I probed the area with a Tresa Endo.  The small abscess cavity was packed with a short length of Kerlix roll which was trimmed.  A sterile dressing was applied.  The patient was woken from anesthesia and transferred the postanesthesia care unit in stable condition.  All sponge needle counts were correct at the end of this procedure.  At the end of the case we reviewed the infection status of the case. Patient: Michael [name] Emergency General Surgery Service Patient Case: Urgent Infection Present At Time Of Surgery (PATOS): Abscess location: Perianal  Ivar Drape, MD General, Bariatric, & Minimally Invasive  Surgery [facility], [place]

## 2023-09-26 NOTE — Progress Notes (Signed)
Patient seen in preop.  Perianal abscess, recommend incision and drainage.  Discussed procedure, risks, benefits and alternatives.  Patient granted consent to proceed.  Quentin Ore, MD General, Bariatric and Minimally Invasive Surgery [facility]

## 2023-09-26 NOTE — ED Provider Notes (Signed)
I was asked to perform an I&D on this patient's perineum, by my attending Dr. Suezanne Jacquet,  attempt was performed, by my student, Jasmine, unfortunately no purulent discharge, just bleeding.  Area was packed, however wound was not deep.  He had a difficult time tolerating tolerating this procedure.  Dr. Suezanne Jacquet to discuss with surgery.  INCISION AND DRAINAGE Performed by: Gardiner Coins PA student [facility] Consent: Verbal consent obtained. Risks and benefits: risks, benefits and alternatives were discussed Type: abscess  Body area: perineum  Anesthesia: local infiltration  Incision was made with a scalpel.  Local anesthetic: lidocaine 1% w/o epinephrine  Anesthetic total: 5 ml  Complexity: complex Attempted blunt dissection to break up loculations  Drainage: bloody  Drainage amount: minimal  Packing material: 1/4 in iodoform gauze  Patient tolerance: Patient did not tolerate procedure, minimal purulent discharge, mostly bloody in nature.  Procedure stopped, due to lack of progression of procedure, and intolerance.  Informed Dr. Suezanne Jacquet. Will need general surgery eval   Pete Pelt, PA 09/26/23 1333    Lonell Grandchild, MD 09/27/23 1319

## 2023-09-26 NOTE — ED Provider Notes (Signed)
[place] EMERGENCY DEPARTMENT AT [facility] Provider Note  CSN: [id number] Arrival date & time: 09/25/23 1617  Chief Complaint(s) Abscess  HPI Michael [name] is a 52 y.o. male history of hyperlipidemia, hypertension, prediabetes presenting to the emergency department swelling.  Patient reports swelling to his perineal area.  He reports has been present for 4 days, worsening.  Feels similar to previous episode of abscess.  Had subjective fever yesterday.  No nausea or vomiting.  No abdominal pain.   Past Medical History Past Medical History:  Diagnosis Date   Hyperlipidemia    no meds, diet   Hypertension    Knee pain    right   Pre-diabetes    no meds, diet   Patient Active Problem List   Diagnosis Date Noted   Perirectal abscess 05/10/2022   Prediabetes 07/20/2020   Suspected sleep apnea 07/20/2020   Hemorrhoids, external, thrombosed 05/21/2013   Home Medication(s) Prior to Admission medications   Medication Sig Start Date End Date Taking? Authorizing Provider  ibuprofen (ADVIL,MOTRIN) 800 MG tablet Take 1 tablet (800 mg total) by mouth 3 (three) times daily. Patient taking differently: Take 800 mg by mouth every 8 (eight) hours as needed for moderate pain (pain score 4-6). 05/22/18  Yes Wieters, Hallie C, PA-C  Multiple Vitamins-Minerals (MULTIVITAMIN MEN 50+) TABS Take 1 tablet by mouth daily.   Yes [provider]  amoxicillin (AMOXIL) 875 MG tablet Take 875 mg by mouth 2 (two) times daily. Patient not taking: Reported on 09/26/2023    [provider]                                                                                                                                    Past Surgical History Past Surgical History:  Procedure Laterality Date   HEMORROIDECTOMY     INCISION AND DRAINAGE PERIRECTAL ABSCESS N/A 05/10/2022   Procedure: IRRIGATION AND DEBRIDEMENT PERIRECTAL ABSCESS;  Surgeon: Sheliah Hatch, De Blanch, MD;  Location: [facility]  OR;  Service: General;  Laterality: N/A;   LEG SURGERY     left   Family History Family History  Problem Relation Age of Onset   Diabetes Mother    Diabetes Father     Social History Social History   Tobacco Use   Smoking status: Former    Current packs/day: 0.00    Types: Cigarettes    Quit date: 09/28/2011    Years since quitting: 12.0   Smokeless tobacco: Never  Vaping Use   Vaping status: Never Used  Substance Use Topics   Alcohol use: Yes    Alcohol/week: 20.0 standard drinks of alcohol    Types: 20 Cans of beer per week   Drug use: No   Allergies Patient has no known allergies.  Review of Systems Review of Systems  All other systems reviewed and are negative.   Physical Exam Vital Signs  I have reviewed the triage  vital signs BP 114/75   Pulse 92   Temp 98.9 F (37.2 C)   Resp 18   Ht 5\' 6"  (1.676 m)   Wt 105.2 kg   SpO2 97%   BMI 37.45 kg/m  Physical Exam Vitals and nursing note reviewed. Exam conducted with a chaperone present.  Constitutional:      General: He is not in acute distress.    Appearance: Normal appearance.  HENT:     Mouth/Throat:     Mouth: Mucous membranes are moist.  Eyes:     Conjunctiva/sclera: Conjunctivae normal.  Cardiovascular:     Rate and Rhythm: Normal rate and regular rhythm.  Pulmonary:     Effort: Pulmonary effort is normal. No respiratory distress.     Breath sounds: Normal breath sounds.  Abdominal:     General: Abdomen is flat.     Palpations: Abdomen is soft.     Tenderness: There is no abdominal tenderness.  Genitourinary:    Comments: Significant swelling to the inferior perianal/perineal region with tenderness and fluctuance Musculoskeletal:     Right lower leg: No edema.     Left lower leg: No edema.  Skin:    General: Skin is warm and dry.     Capillary Refill: Capillary refill takes less than 2 seconds.  Neurological:     Mental Status: He is alert and oriented to person, place, and time.  Mental status is at baseline.  Psychiatric:        Mood and Affect: Mood normal.        Behavior: Behavior normal.     ED Results and Treatments Labs (all labs ordered are listed, but only abnormal results are displayed) Labs Reviewed  CBC WITH DIFFERENTIAL/PLATELET - Abnormal; Notable for the following components:      Result Value   WBC 12.3 (*)    Neutro Abs 8.6 (*)    Monocytes Absolute 1.2 (*)    All other components within normal limits  COMPREHENSIVE METABOLIC PANEL - Abnormal; Notable for the following components:   Glucose, Bld 199 (*)    Calcium 8.8 (*)    Albumin 3.1 (*)    All other components within normal limits  URINALYSIS, ROUTINE W REFLEX MICROSCOPIC - Abnormal; Notable for the following components:   Specific Gravity, Urine 1.031 (*)    Glucose, UA 150 (*)    Protein, ur 30 (*)    All other components within normal limits  HEMOGLOBIN A1C - Abnormal; Notable for the following components:   Hgb A1c MFr Bld 7.4 (*)    All other components within normal limits  LIPASE, BLOOD                                                                                                                          Radiology CT PELVIS W CONTRAST  Result Date: 09/26/2023 CLINICAL DATA:  Rectal pain for the past week. History of perianal abscess. EXAM: CT PELVIS WITH CONTRAST TECHNIQUE:  Multidetector CT imaging of the pelvis was performed using the standard protocol following the bolus administration of intravenous contrast. RADIATION DOSE REDUCTION: This exam was performed according to the departmental dose-optimization program which includes automated exposure control, adjustment of the mA and/or kV according to patient size and/or use of iterative reconstruction technique. CONTRAST:  75mL OMNIPAQUE IOHEXOL 350 MG/ML SOLN COMPARISON:  CT pelvis dated May 09, 2022. FINDINGS: Urinary Tract:  No abnormality visualized. Bowel: Unremarkable visualized pelvic bowel loops. Normal appendix.  Vascular/Lymphatic: No pathologically enlarged lymph nodes. No significant vascular abnormality seen. Reproductive: Normal size prostate gland with unchanged central coarse calcifications. Other: Recurrent thick-walled, rim enhancing fluid collection in the perineum right of midline anterior to the anus, measuring 4.8 x 3.0 x 8.0 cm. There is surrounding soft tissue inflammation. No involvement of the ischiorectal fossa or extension above the levator ani. Musculoskeletal: No suspicious bone lesions identified. IMPRESSION: 1. Recurrent 4.8 x 3.0 x 8.0 cm perianal abscess. Electronically Signed   By: Obie Dredge M.D.   On: 09/26/2023 12:42    Pertinent labs & imaging results that were available during my care of the patient were reviewed by me and considered in my medical decision making (see MDM for details).  Medications Ordered in ED Medications  lactated ringers infusion (has no administration in time range)  acetaminophen (TYLENOL) tablet 1,000 mg (has no administration in time range)  morphine (PF) 2 MG/ML injection 2-4 mg (has no administration in time range)  sodium chloride 0.9 % bolus 1,000 mL (0 mLs Intravenous Stopped 09/26/23 1106)  HYDROmorphone (DILAUDID) injection 0.5 mg (0.5 mg Intravenous Given 09/26/23 0846)  iohexol (OMNIPAQUE) 350 MG/ML injection 75 mL (75 mLs Intravenous Contrast Given 09/26/23 1110)  lidocaine (PF) (XYLOCAINE) 1 % injection 30 mL (30 mLs Intradermal Given 09/26/23 1344)  piperacillin-tazobactam (ZOSYN) IVPB 3.375 g (0 g Intravenous Stopped 09/26/23 1441)  HYDROmorphone (DILAUDID) injection 0.5 mg (0.5 mg Intravenous Given 09/26/23 1406)                                                                                                                                     Procedures Procedures  (including critical care time)  Medical Decision Making / ED Course   MDM:  51 year old male presenting to the emergency department with abscess/swelling.  On  exam, patient has swelling to his perineal area.  No crepitus.  No eschar.  Seems consistent with perianal abscess.  On review of chart, patient has had previous ER visit for this last summer.  ER drainage was attempted but ultimately needed OR drainage.  Reviewing CT scan appears to be in the same location.  Will obtain repeat CT scan to evaluate for any deeper involvement.  Isolated perianal abscess, will try ER incision and drainage, if patient is able to tolerate this possibly discharge versus admit if drainage unsuccessful.  If there is any evidence of deeper involvement on CT will need  surgical consultation.  Clinical Course as of 09/26/23 1531  Tue Sep 26, 2023  1530 CT scan shows perianal abscess.  Incision and drainage was attempted however, no pus was returned.  Discussed with general surgery, they will take patient to the OR tonight to perform exam under anesthesia and incision and drainage.  May be discharged afterwards so they are holding off on admitting the patient.  Patient has received antibiotics and pain control.  Discussed with oncoming physician in case patient has any new events while waiting for OR time. [WS]    Clinical Course User Index [WS] Lonell Grandchild, MD     Additional history obtained: -Additional history obtained from family -External records from outside source obtained and reviewed including: Chart review including previous notes, labs, imaging, consultation notes including prior episode of same    Lab Tests: -I ordered, reviewed, and interpreted labs.   The pertinent results include:   Labs Reviewed  CBC WITH DIFFERENTIAL/PLATELET - Abnormal; Notable for the following components:      Result Value   WBC 12.3 (*)    Neutro Abs 8.6 (*)    Monocytes Absolute 1.2 (*)    All other components within normal limits  COMPREHENSIVE METABOLIC PANEL - Abnormal; Notable for the following components:   Glucose, Bld 199 (*)    Calcium 8.8 (*)    Albumin 3.1 (*)     All other components within normal limits  URINALYSIS, ROUTINE W REFLEX MICROSCOPIC - Abnormal; Notable for the following components:   Specific Gravity, Urine 1.031 (*)    Glucose, UA 150 (*)    Protein, ur 30 (*)    All other components within normal limits  HEMOGLOBIN A1C - Abnormal; Notable for the following components:   Hgb A1c MFr Bld 7.4 (*)    All other components within normal limits  LIPASE, BLOOD    Notable for mild leukocytosis    Imaging Studies ordered: I ordered imaging studies including CT pelvis  On my interpretation imaging demonstrates perianal abscess I independently visualized and interpreted imaging. I agree with the radiologist interpretation   Medicines ordered and prescription drug management: Meds ordered this encounter  Medications   sodium chloride 0.9 % bolus 1,000 mL   HYDROmorphone (DILAUDID) injection 0.5 mg   iohexol (OMNIPAQUE) 350 MG/ML injection 75 mL   lidocaine (PF) (XYLOCAINE) 1 % injection 30 mL   piperacillin-tazobactam (ZOSYN) IVPB 3.375 g    Order Specific Question:   Antibiotic Indication:    Answer:   Wound Infection   HYDROmorphone (DILAUDID) injection 0.5 mg   lactated ringers infusion   acetaminophen (TYLENOL) tablet 1,000 mg   morphine (PF) 2 MG/ML injection 2-4 mg    -I have reviewed the patients home medicines and have made adjustments as needed   Consultations Obtained: I requested consultation with the general surgeon ,  and discussed lab and imaging findings as well as pertinent plan - they recommend: OR drainage   Social Determinants of Health:  Diagnosis or treatment significantly limited by social determinants of health: obesity   Reevaluation: After the interventions noted above, I reevaluated the patient and found that their symptoms have improved  Co morbidities that complicate the patient evaluation  Past Medical History:  Diagnosis Date   Hyperlipidemia    no meds, diet   Hypertension    Knee  pain    right   Pre-diabetes    no meds, diet      Dispostion: Disposition decision including need  for hospitalization was considered, and patient to OR for drainage, possible discharge afterwards.     Final Clinical Impression(s) / ED Diagnoses Final diagnoses:  Perianal abscess     This chart was dictated using voice recognition software.  Despite best efforts to proofread,  errors can occur which can change the documentation meaning.    Lonell Grandchild, MD 09/26/23 [phone]

## 2023-09-26 NOTE — Progress Notes (Signed)
Patient arrived to floor from pacu in stable condition, vss, alert and oriented, perineal incision with penrose drain, kerlix with bloody drainage was changed, patient denied any pain at time.

## 2023-09-26 NOTE — Anesthesia Procedure Notes (Signed)
Procedure Name: Intubation Date/Time: 09/26/2023 8:53 PM  Performed by: Aliyyah Riese T, CRNAPre-anesthesia Checklist: Patient identified, Emergency Drugs available, Suction available and Patient being monitored Patient Re-evaluated:Patient Re-evaluated prior to induction Oxygen Delivery Method: Circle system utilized Preoxygenation: Pre-oxygenation with 100% oxygen Induction Type: IV induction Ventilation: Mask ventilation without difficulty and Oral airway inserted - appropriate to patient size Laryngoscope Size: Miller and 2 Grade View: Grade I Tube type: Oral Tube size: 7.5 mm Number of attempts: 1 Airway Equipment and Method: Stylet and Oral airway Placement Confirmation: ETT inserted through vocal cords under direct vision, positive ETCO2 and breath sounds checked- equal and bilateral Secured at: 22 cm Tube secured with: Tape Dental Injury: Injury to lip  Difficulty Due To: Difficult Airway- due to large tongue

## 2023-09-26 NOTE — Anesthesia Preprocedure Evaluation (Addendum)
Anesthesia Evaluation  Patient identified by MRN, date of birth, ID band Patient awake    Reviewed: Allergy & Precautions, NPO status , Patient's Chart, lab work & pertinent test results  Airway Mallampati: II  TM Distance: >3 FB Neck ROM: Full    Dental no notable dental hx.    Pulmonary former smoker   Pulmonary exam normal        Cardiovascular hypertension, Normal cardiovascular exam     Neuro/Psych negative neurological ROS  negative psych ROS   GI/Hepatic negative GI ROS,,,(+)     substance abuse    Endo/Other  negative endocrine ROS    Renal/GU negative Renal ROS     Musculoskeletal negative musculoskeletal ROS (+)    Abdominal  (+) + obese  Peds  Hematology negative hematology ROS (+)   Anesthesia Other Findings perineal abscess  Reproductive/Obstetrics                             Anesthesia Physical Anesthesia Plan  ASA: 2  Anesthesia Plan: General   Post-op Pain Management:    Induction: Intravenous  PONV Risk Score and Plan: 2 and Ondansetron, Dexamethasone, Midazolam and Treatment may vary due to age or medical condition  Airway Management Planned: Oral ETT  Additional Equipment:   Intra-op Plan:   Post-operative Plan: Extubation in OR  Informed Consent: I have reviewed the patients History and Physical, chart, labs and discussed the procedure including the risks, benefits and alternatives for the proposed anesthesia with the patient or authorized representative who has indicated his/her understanding and acceptance.     Dental advisory given  Plan Discussed with: CRNA  Anesthesia Plan Comments:        Anesthesia Quick Evaluation

## 2023-09-26 NOTE — Anesthesia Postprocedure Evaluation (Signed)
Anesthesia Post Note  Patient: Michael [name]  Procedure(s) Performed: IRRIGATION AND DEBRIDEMENT PERINEAL  ABSCESS EXAM UNDER ANESTHESIA     Patient location during evaluation: PACU Anesthesia Type: General Level of consciousness: awake Pain management: pain level controlled Vital Signs Assessment: post-procedure vital signs reviewed and stable Respiratory status: spontaneous breathing, nonlabored ventilation and respiratory function stable Cardiovascular status: blood pressure returned to baseline and stable Postop Assessment: no apparent nausea or vomiting Anesthetic complications: no   No notable events documented.  Last Vitals:  Vitals:   09/26/23 2200 09/26/23 2215  BP: 111/79 121/83  Pulse:  86  Resp:  16  Temp: 36.8 C 36.9 C  SpO2:  94%    Last Pain:  Vitals:   09/26/23 2215  TempSrc: Oral  PainSc:                  Catheryn Bacon Theresa Dohrman

## 2023-09-26 NOTE — ED Notes (Addendum)
OR scheduled for I&D around 1943. PT alert, NAD, calm, interactive, resps e/u, speaking in clea rcomplete sentences. VSS. Family at [facility] States, "feel better", denies pain or other sx.

## 2023-09-26 NOTE — Transfer of Care (Signed)
Immediate Anesthesia Transfer of Care Note  Patient: Michael [name]  Procedure(s) Performed: IRRIGATION AND DEBRIDEMENT PERINEAL  ABSCESS EXAM UNDER ANESTHESIA  Patient Location: PACU  Anesthesia Type:General  Level of Consciousness: awake, alert , and oriented  Airway & Oxygen Therapy: Patient Spontanous Breathing and Patient connected to nasal cannula oxygen  Post-op Assessment: Report given to RN, Post -op Vital signs reviewed and stable, and Patient moving all extremities  Post vital signs: Reviewed and stable  Last Vitals:  Vitals Value Taken Time  BP 114/83 09/26/23 2130  Temp    Pulse 86 09/26/23 2134  Resp 24 09/26/23 2134  SpO2 96 % 09/26/23 2134  Vitals shown include unfiled device data.  Last Pain:  Vitals:   09/26/23 1635  TempSrc:   PainSc: 0-No pain         Complications: No notable events documented.

## 2023-09-26 NOTE — H&P (Addendum)
Michael [name] October 22, 1970  [id number].    Requesting MD: Suezanne Jacquet, MD Chief Complaint/Reason for Consult: perineal abscess   HPI:  Michael [name] is a 52 y/o M with history of obesity, perineal abscess, and hemorrhoids who presents with one week of perineal pain that is worse with walking. He reports associated fever and chills. He denies nausea, vomiting, constipation, or diarrhea. He denies blood in his stool currently but does report a history of hemorrhoids with occasional blood in his stool. He tells me that he had similar pain one year ago and had to have an I&D at that time (per chart review had perineal I&D by Dr. Sheliah Hatch 04/2022). He denies seeing a surgeon in the past regarding his hemorrhoids. He reports no known history of diabetes, pulmonary issues, or cardiac issues but he does not go to the doctor. He reports intermittent SOB at baseline that improves with rest. Denies chest pain. He denies tobacco use and reports drinking alcohol heavily, "sometimes daily", 12 beers. He denies a history of EtOH withdrawal. 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 Lives at home with his wife.   He does report having a tooth pulled 2 weeks ago and being on a coarse of antibiotics for one week afterwards. Denies any complications from that procedure.  Review of Systems  All other systems reviewed and are negative.   Family History  Problem Relation Age of Onset   Diabetes Mother    Diabetes Father     Past Medical History:  Diagnosis Date   Hyperlipidemia    no meds, diet   Hypertension    Knee pain    right   Pre-diabetes    no meds, diet    Past Surgical History:  Procedure Laterality Date   HEMORROIDECTOMY     INCISION AND DRAINAGE PERIRECTAL ABSCESS N/A 05/10/2022   Procedure: IRRIGATION AND DEBRIDEMENT PERIRECTAL ABSCESS;  Surgeon: Sheliah Hatch De Blanch, MD;  Location: [facility] OR;  Service: General;  Laterality: N/A;   LEG SURGERY     left     Social History:  reports that he quit smoking about 12 years ago. His smoking use included cigarettes. He has never used smokeless tobacco. He reports current alcohol use of about 20.0 standard drinks of alcohol per week. He reports that he does not use drugs.  Allergies: No Known Allergies  (Not in a hospital admission)    Physical Exam: Blood pressure 114/75, pulse 92, temperature 98.9 F (37.2 C), resp. rate 18, height 5\' 6"  (1.676 m), weight 105.2 kg, SpO2 97%. General: Pleasant male, laying on hospital bed, appears stated age, NAD  HEENT: head -normocephalic, atraumatic; Eyes: PERRLA, no conjunctival injection, anicteric sclerae Neck- Trachea is midline CV- RRR, normal S1/S2, no M/R/G, no lower extremity edema  Pulm- breathing is slightly labored on room air Abd- soft, obese, non-tender, non-distended GU- Physical Exam Genitourinary:     There is an incision from I&D in the ED with 1/4 iodoform packing in the perineal region. There is induration and tenderness around this area that is more notable in the right perineal region compared to the left. There is a small pustule of the right gluteal cleft. No hemorrhoids on external exam of the anus.  MSK- UE/LE symmetrical, no cyanosis, clubbing, or edema. Neuro- CN II-XII grossly in tact, no paresthesias. Psych- Alert and Oriented x3 with appropriate affect Skin: warm and dry, no rashes or lesions   Results for orders placed or performed  during the hospital encounter of 09/25/23 (from the past 48 hour(s))  Urinalysis, Routine w reflex microscopic -Urine, Clean Catch     Status: Abnormal   Collection Time: 09/25/23  6:40 PM  Result Value Ref Range   Color, Urine YELLOW YELLOW   APPearance CLEAR CLEAR   Specific Gravity, Urine 1.031 (H) 1.005 - 1.030   pH 5.0 5.0 - 8.0   Glucose, UA 150 (A) NEGATIVE mg/dL   Hgb urine dipstick NEGATIVE NEGATIVE   Bilirubin Urine NEGATIVE NEGATIVE   Ketones, ur NEGATIVE NEGATIVE mg/dL    Protein, ur 30 (A) NEGATIVE mg/dL   Nitrite NEGATIVE NEGATIVE   Leukocytes,Ua NEGATIVE NEGATIVE   RBC / HPF 0-5 0 - 5 RBC/hpf   WBC, UA 0-5 0 - 5 WBC/hpf   Bacteria, UA NONE SEEN NONE SEEN   Squamous Epithelial / HPF 0-5 0 - 5 /HPF   Mucus PRESENT     Comment: Performed at [facility], [address] [place], [place] [address]  CBC with Differential     Status: Abnormal   Collection Time: 09/25/23  6:43 PM  Result Value Ref Range   WBC 12.3 (H) 4.0 - 10.5 K/uL   RBC 4.82 4.22 - 5.81 MIL/uL   Hemoglobin 14.8 13.0 - 17.0 g/dL   HCT 21.3 08.6 - 57.8 %   MCV 94.4 80.0 - 100.0 fL   MCH 30.7 26.0 - 34.0 pg   MCHC 32.5 30.0 - 36.0 g/dL   RDW 46.9 62.9 - 52.8 %   Platelets 280 150 - 400 K/uL   nRBC 0.0 0.0 - 0.2 %   Neutrophils Relative % 68 %   Neutro Abs 8.6 (H) 1.7 - 7.7 K/uL   Lymphocytes Relative 16 %   Lymphs Abs 1.9 0.7 - 4.0 K/uL   Monocytes Relative 10 %   Monocytes Absolute 1.2 (H) 0.1 - 1.0 K/uL   Eosinophils Relative 4 %   Eosinophils Absolute 0.5 0.0 - 0.5 K/uL   Basophils Relative 1 %   Basophils Absolute 0.1 0.0 - 0.1 K/uL   Immature Granulocytes 1 %   Abs Immature Granulocytes 0.06 0.00 - 0.07 K/uL    Comment: Performed at [facility], [address], [place], [place] [address]  Comprehensive metabolic panel     Status: Abnormal   Collection Time: 09/25/23  6:43 PM  Result Value Ref Range   Sodium 139 135 - 145 mmol/L   Potassium 3.8 3.5 - 5.1 mmol/L   Chloride 106 98 - 111 mmol/L   CO2 23 22 - 32 mmol/L   Glucose, Bld 199 (H) 70 - 99 mg/dL    Comment: Glucose reference range applies only to samples taken after fasting for at least 8 hours.   BUN 15 6 - 20 mg/dL   Creatinine, Ser 4.01 0.61 - 1.24 mg/dL   Calcium 8.8 (L) 8.9 - 10.3 mg/dL   Total Protein 6.5 6.5 - 8.1 g/dL   Albumin 3.1 (L) 3.5 - 5.0 g/dL   AST 18 15 - 41 U/L   ALT 34 0 - 44 U/L   Alkaline Phosphatase 115 38 - 126 U/L   Total Bilirubin 0.7 <1.2 mg/dL   GFR, Estimated >02 >72  mL/min    Comment: (NOTE) Calculated using the CKD-EPI Creatinine Equation (2021)    Anion gap 10 5 - 15    Comment: Performed at [facility], [address] [place], [place] [address]  Lipase, blood  Status: None   Collection Time: 09/25/23  6:43 PM  Result Value Ref Range   Lipase 36 11 - 51 U/L    Comment: Performed at [facility], [address], [place], [place] [address]   CT PELVIS W CONTRAST  Result Date: 09/26/2023 CLINICAL DATA:  Rectal pain for the past week. History of perianal abscess. EXAM: CT PELVIS WITH CONTRAST TECHNIQUE: Multidetector CT imaging of the pelvis was performed using the standard protocol following the bolus administration of intravenous contrast. RADIATION DOSE REDUCTION: This exam was performed according to the departmental dose-optimization program which includes automated exposure control, adjustment of the mA and/or kV according to patient size and/or use of iterative reconstruction technique. CONTRAST:  75mL OMNIPAQUE IOHEXOL 350 MG/ML SOLN COMPARISON:  CT pelvis dated May 09, 2022. FINDINGS: Urinary Tract:  No abnormality visualized. Bowel: Unremarkable visualized pelvic bowel loops. Normal appendix. Vascular/Lymphatic: No pathologically enlarged lymph nodes. No significant vascular abnormality seen. Reproductive: Normal size prostate gland with unchanged central coarse calcifications. Other: Recurrent thick-walled, rim enhancing fluid collection in the perineum right of midline anterior to the anus, measuring 4.8 x 3.0 x 8.0 cm. There is surrounding soft tissue inflammation. No involvement of the ischiorectal fossa or extension above the levator ani. Musculoskeletal: No suspicious bone lesions identified. IMPRESSION: 1. Recurrent 4.8 x 3.0 x 8.0 cm perianal abscess. Electronically Signed   By: Obie Dredge M.D.   On: 09/26/2023 12:42      Assessment/Plan Recurrent perineal abscess  - afebrile, WBC 12  - unsuccessful bedside I&D by ED  provider.  - recommend proceeding to the OR for exam under anesthesia, I&D of perineal abscess  - check A1c, in 2021 it was 6.3%.    FEN - NPO, IVF VTE - SCD's ID - Zosyn Admit - CCS service, plan discharge from PACU vs admission overnight for observation. If he stays overnight he will need CIWA protocol for daily EtOH use.   Obesity Hyperglycemia Alcohol use   I reviewed nursing notes, ED provider notes, last 24 h vitals and pain scores, last 48 h intake and output, last 24 h labs and trends, and last 24 h imaging results.  Adam Phenix, [facility] 09/26/2023, 2:27 PM Please see Amion for pager number during day hours 7:00am-4:30pm or 7:00am -11:30am on weekends

## 2023-09-27 ENCOUNTER — Other Ambulatory Visit (HOSPITAL_COMMUNITY): Payer: Self-pay

## 2023-09-27 ENCOUNTER — Encounter (HOSPITAL_COMMUNITY): Payer: Self-pay | Admitting: Surgery

## 2023-09-27 DIAGNOSIS — L02215 Cutaneous abscess of perineum: Secondary | ICD-10-CM

## 2023-09-27 DIAGNOSIS — E119 Type 2 diabetes mellitus without complications: Secondary | ICD-10-CM

## 2023-09-27 DIAGNOSIS — E669 Obesity, unspecified: Secondary | ICD-10-CM | POA: Insufficient documentation

## 2023-09-27 MED ORDER — INSULIN ASPART 100 UNIT/ML IJ SOLN: 0.0000 [IU] | Freq: Every day | INTRAMUSCULAR | Status: DC

## 2023-09-27 MED ORDER — INSULIN ASPART 100 UNIT/ML IJ SOLN: 0.0000 [IU] | Freq: Three times a day (TID) | INTRAMUSCULAR | Status: DC

## 2023-09-27 MED ORDER — GLIPIZIDE 5 MG PO TABS: 5.0000 mg | ORAL_TABLET | Freq: Every day | ORAL | Status: DC

## 2023-09-27 MED ORDER — GLIPIZIDE 5 MG PO TABS
5.0000 mg | ORAL_TABLET | Freq: Every day | ORAL | 1 refills | Status: DC
  Filled 2023-09-27: qty 30, 30d supply, fill #0

## 2023-09-27 MED ORDER — POLYETHYLENE GLYCOL 3350 17 GM/SCOOP PO POWD
17.0000 g | Freq: Every day | ORAL | 0 refills | Status: AC | PRN
  Filled 2023-09-27: qty 238, 14d supply, fill #0

## 2023-09-27 MED ORDER — ACETAMINOPHEN 325 MG PO TABS
650.0000 mg | ORAL_TABLET | Freq: Four times a day (QID) | ORAL | 0 refills | Status: AC | PRN
  Filled 2023-09-27: qty 30, 4d supply, fill #0

## 2023-09-27 MED ORDER — OXYCODONE HCL 5 MG PO TABS
5.0000 mg | ORAL_TABLET | Freq: Four times a day (QID) | ORAL | 0 refills | Status: DC | PRN
  Filled 2023-09-27: qty 20, 5d supply, fill #0

## 2023-09-27 MED ORDER — AMOXICILLIN-POT CLAVULANATE 875-125 MG PO TABS
1.0000 | ORAL_TABLET | Freq: Two times a day (BID) | ORAL | 0 refills | Status: AC
Start: 2023-09-27 — End: 2023-10-02
  Filled 2023-09-27: qty 10, 5d supply, fill #0

## 2023-09-27 NOTE — Discharge Summary (Signed)
[facility] Discharge Summary   Patient ID: Michael [name] MRN: [id number] DOB/AGE: [birth year] 52 y.o.  Admit date: 09/25/2023 Discharge date: 09/27/2023  Admitting Diagnosis: Perineal abscess  Discharge Diagnosis Patient Active Problem List   Diagnosis Date Noted   Perineal abscess 09/26/2023   Perirectal abscess 05/10/2022   Prediabetes 07/20/2020   Suspected sleep apnea 07/20/2020   Hemorrhoids, external, thrombosed 05/21/2013    Consultants Internal medicine   Imaging: CT PELVIS W CONTRAST  Result Date: 09/26/2023 CLINICAL DATA:  Rectal pain for the past week. History of perianal abscess. EXAM: CT PELVIS WITH CONTRAST TECHNIQUE: Multidetector CT imaging of the pelvis was performed using the standard protocol following the bolus administration of intravenous contrast. RADIATION DOSE REDUCTION: This exam was performed according to the departmental dose-optimization program which includes automated exposure control, adjustment of the mA and/or kV according to patient size and/or use of iterative reconstruction technique. CONTRAST:  75mL OMNIPAQUE IOHEXOL 350 MG/ML SOLN COMPARISON:  CT pelvis dated May 09, 2022. FINDINGS: Urinary Tract:  No abnormality visualized. Bowel: Unremarkable visualized pelvic bowel loops. Normal appendix. Vascular/Lymphatic: No pathologically enlarged lymph nodes. No significant vascular abnormality seen. Reproductive: Normal size prostate gland with unchanged central coarse calcifications. Other: Recurrent thick-walled, rim enhancing fluid collection in the perineum right of midline anterior to the anus, measuring 4.8 x 3.0 x 8.0 cm. There is surrounding soft tissue inflammation. No involvement of the ischiorectal fossa or extension above the levator ani. Musculoskeletal: No suspicious bone lesions identified. IMPRESSION: 1. Recurrent 4.8 x 3.0 x 8.0 cm perianal abscess. Electronically Signed   By: Obie Dredge M.D.   On: 09/26/2023 12:42     Procedures Dr. Dossie Der 09/26/23 -  IRRIGATION AND DEBRIDEMENT PERINEAL  ABSCESS:  EXAM UNDER ANESTHESIA: [id number] -- placement penrose drain  Hospital Course:  52 y/o M with PMH obesity, daily EtOh use, and perineal abscess who presented to [facility] with one week of perineal pain.  Workup showed recurrent perineal abscess.  Patient was admitted and underwent procedure listed above.  Tolerated procedure well and was transferred to the floor.  Diet was advanced as tolerated.  On POD#1 surgical packing was removed. the patient was voiding well, tolerating diet, ambulating well, pain well controlled, vital signs stable, incisions c/d/i and felt stable for discharge home. His hgb A1c was elevated at 7.4% so the internal medicine service was consulted for his new diagnosis of diabetes.  Patient will follow up in our office as below weeks and knows to call with questions or concerns.  I have personally reviewed the patients medication history on the [place] controlled substance database.  Physical Exam: General:  Alert, NAD, pleasant, comfortable Pulm: mild SOB at rest Abd: soft obese GU": packing removed from a small surgical wound in the right gluteal cleft, there is a perineal wound with a penrose drain in place. Area is clean and soft without cellulitis   Allergies as of 09/27/2023   No Known Allergies      Medication List     STOP taking these medications    amoxicillin 875 MG tablet Commonly known as: AMOXIL       TAKE these medications    acetaminophen 325 MG tablet Commonly known as: TYLENOL Take 2 tablets (650 mg total) by mouth every 6 (six) hours as needed for mild pain (pain score 1-3) or moderate pain (pain score 4-6).   amoxicillin-clavulanate 875-125 MG tablet Commonly known as: AUGMENTIN Take 1 tablet by mouth 2 (two) times daily for 5  days.   glipiZIDE 5 MG tablet Commonly known as: GLUCOTROL Take 1 tablet (5 mg total) by mouth daily before breakfast. Start taking  on: September 28, 2023   ibuprofen 800 MG tablet Commonly known as: ADVIL Take 1 tablet (800 mg total) by mouth 3 (three) times daily. What changed:  when to take this reasons to take this   Multivitamin Men 50+ Tabs Take 1 tablet by mouth daily.   oxyCODONE 5 MG immediate release tablet Commonly known as: Oxy IR/ROXICODONE Take 1 tablet (5 mg total) by mouth every 6 (six) hours as needed for severe pain (pain score 7-10) (not releived by tylenol or advil).   polyethylene glycol powder 17 GM/SCOOP powder Commonly known as: GLYCOLAX/MIRALAX Mix and take 1 capful (17 g) with water by mouth daily as needed for mild constipation.          Follow-up Information     Grayce Sessions, NP Follow up.   Specialty: Internal Medicine Why: October 16, 2023 at 1030 am Contact information: [address] [place] [address] [phone]         Stechschulte, Hyman Hopes, MD Follow up.   Specialty: Surgery Why: our office is scheduling you for post-operative follow up in about 2 weeks. call to confirm appointment date/time. Contact information: [address] [place] [address] [phone]                 Signed: Hosie Spangle, [facility] 09/27/2023, 1:58 PM

## 2023-09-27 NOTE — Progress Notes (Deleted)
[facility] Discharge Summary   Patient ID: NORWOOD DENHART MRN: [id number] DOB/AGE: [birth year] 52 y.o.  Admit date: 09/25/2023 Discharge date: 09/27/2023  Admitting Diagnosis: Perineal abscess  Discharge Diagnosis Patient Active Problem List   Diagnosis Date Noted   Perineal abscess 09/26/2023   Perirectal abscess 05/10/2022   Prediabetes 07/20/2020   Suspected sleep apnea 07/20/2020   Hemorrhoids, external, thrombosed 05/21/2013    Consultants Internal medicine   Imaging: CT PELVIS W CONTRAST  Result Date: 09/26/2023 CLINICAL DATA:  Rectal pain for the past week. History of perianal abscess. EXAM: CT PELVIS WITH CONTRAST TECHNIQUE: Multidetector CT imaging of the pelvis was performed using the standard protocol following the bolus administration of intravenous contrast. RADIATION DOSE REDUCTION: This exam was performed according to the departmental dose-optimization program which includes automated exposure control, adjustment of the mA and/or kV according to patient size and/or use of iterative reconstruction technique. CONTRAST:  75mL OMNIPAQUE IOHEXOL 350 MG/ML SOLN COMPARISON:  CT pelvis dated May 09, 2022. FINDINGS: Urinary Tract:  No abnormality visualized. Bowel: Unremarkable visualized pelvic bowel loops. Normal appendix. Vascular/Lymphatic: No pathologically enlarged lymph nodes. No significant vascular abnormality seen. Reproductive: Normal size prostate gland with unchanged central coarse calcifications. Other: Recurrent thick-walled, rim enhancing fluid collection in the perineum right of midline anterior to the anus, measuring 4.8 x 3.0 x 8.0 cm. There is surrounding soft tissue inflammation. No involvement of the ischiorectal fossa or extension above the levator ani. Musculoskeletal: No suspicious bone lesions identified. IMPRESSION: 1. Recurrent 4.8 x 3.0 x 8.0 cm perianal abscess. Electronically Signed   By: Obie Dredge M.D.   On: 09/26/2023 12:42     Procedures Dr. Dossie Der 09/26/23 -  IRRIGATION AND DEBRIDEMENT PERINEAL  ABSCESS:  EXAM UNDER ANESTHESIA: [id number] -- placement penrose drain  Hospital Course:  52 y/o M with PMH obesity, daily EtOh use, and perineal abscess who presented to [facility] with one week of perineal pain.  Workup showed recurrent perineal abscess.  Patient was admitted and underwent procedure listed above.  Tolerated procedure well and was transferred to the floor.  Diet was advanced as tolerated.  On POD#1 surgical packing was removed. the patient was voiding well, tolerating diet, ambulating well, pain well controlled, vital signs stable, incisions c/d/i and felt stable for discharge home. His hgb A1c was elevated at 7.4% so the internal medicine service was consulted for his new diagnosis of diabetes.  Patient will follow up in our office as below weeks and knows to call with questions or concerns.  I have personally reviewed the patients medication history on the [place] controlled substance database.  Physical Exam: General:  Alert, NAD, pleasant, comfortable Pulm: mild SOB at rest Abd: soft obese GU": packing removed from a small surgical wound in the right gluteal cleft, there is a perineal wound with a penrose drain in place. Area is clean and soft without cellulitis   Allergies as of 09/27/2023   No Known Allergies      Medication List     STOP taking these medications    amoxicillin 875 MG tablet Commonly known as: AMOXIL       TAKE these medications    acetaminophen 325 MG tablet Commonly known as: TYLENOL Take 2 tablets (650 mg total) by mouth every 6 (six) hours as needed for mild pain (pain score 1-3) or moderate pain (pain score 4-6).   amoxicillin-clavulanate 875-125 MG tablet Commonly known as: AUGMENTIN Take 1 tablet by mouth 2 (two) times daily for 5  days.   glipiZIDE 5 MG tablet Commonly known as: GLUCOTROL Take 1 tablet (5 mg total) by mouth daily before breakfast. Start taking  on: September 28, 2023   ibuprofen 800 MG tablet Commonly known as: ADVIL Take 1 tablet (800 mg total) by mouth 3 (three) times daily. What changed:  when to take this reasons to take this   Multivitamin Men 50+ Tabs Take 1 tablet by mouth daily.   oxyCODONE 5 MG immediate release tablet Commonly known as: Oxy IR/ROXICODONE Take 1 tablet (5 mg total) by mouth every 6 (six) hours as needed for severe pain (pain score 7-10) (not releived by tylenol or advil).   polyethylene glycol powder 17 GM/SCOOP powder Commonly known as: GLYCOLAX/MIRALAX Mix and take 1 capful (17 g) with water by mouth daily as needed for mild constipation.          Follow-up Information     Grayce Sessions, NP Follow up.   Specialty: Internal Medicine Why: October 16, 2023 at 1030 am Contact information: [address] [place] [address] [phone]         Stechschulte, Hyman Hopes, MD Follow up.   Specialty: Surgery Why: our office is scheduling you for post-operative follow up in about 2 weeks. call to confirm appointment date/time. Contact information: [address] [place] [address] [phone]                 Signed: Hosie Spangle, [facility] 09/27/2023, 1:58 PM

## 2023-09-27 NOTE — Progress Notes (Signed)
Patient discharged to home, AVS reviewed including medications and follow up appointments. IV removed, site clean dry and intact.

## 2023-09-27 NOTE — TOC Initial Note (Signed)
Transition of Care (TOC) - Initial/Assessment Note   Spoke to patient and wife at bedside.   Confirmed face sheet information.   Patient does not have insurance. Emailed Conservation officer, historic buildings.   Patient does not have a PCP, agreeable to a [facility], appointment scheduled and placed on AVS. Appointment schedled and placed on AVS.   PA will send prescriptions to [facility]. [facility] will call patient with cost. Patient voiced understanding  Patient Details  Name: Michael [name] MRN: [id number] Date of Birth: 08-Feb-1971  Transition of Care [facility]) CM/SW Contact:    Kingsley Plan, RN Phone Number: 09/27/2023, 11:18 AM  Clinical Narrative:                   Expected Discharge Plan: Home/Self Care Barriers to Discharge: No Barriers Identified   Patient Goals and CMS Choice Patient states their goals for this hospitalization and ongoing recovery are:: to return to home          Expected Discharge Plan and Services In-house Referral: Financial Counselor Discharge Planning Services: CM Consult Post Acute Care Choice: NA Living arrangements for the past 2 months: Single Family Home                 DME Arranged: N/A DME Agency: NA       HH Arranged: NA HH Agency: NA        Prior Living Arrangements/Services Living arrangements for the past 2 months: Single Family Home Lives with:: Spouse Patient language and need for interpreter reviewed:: Yes Do you feel safe going back to the place where you live?: Yes      Need for Family Participation in Patient Care: Yes (Comment) Care giver support system in place?: Yes (comment)   Criminal Activity/Legal Involvement Pertinent to Current Situation/Hospitalization: No - Comment as needed  Activities of Daily Living   ADL Screening (condition at time of admission) Independently performs ADLs?: Yes (appropriate for developmental age) Is the patient deaf or have difficulty hearing?: No Does the patient have difficulty  seeing, even when wearing glasses/contacts?: No Does the patient have difficulty concentrating, remembering, or making decisions?: No  Permission Sought/Granted   Permission granted to share information with : Yes, Verbal Permission Granted  Share Information with NAME: wife           Emotional Assessment Appearance:: Appears stated age Attitude/Demeanor/Rapport: Engaged Affect (typically observed): Accepting Orientation: : Oriented to Self, Oriented to Place, Oriented to  Time, Oriented to Situation Alcohol / Substance Use: Not Applicable Psych Involvement: No (comment)  Admission diagnosis:  Perianal abscess [K61.0] Perineal abscess [L02.215] Patient Active Problem List   Diagnosis Date Noted   Perineal abscess 09/26/2023   Perirectal abscess 05/10/2022   Prediabetes 07/20/2020   Suspected sleep apnea 07/20/2020   Hemorrhoids, external, thrombosed 05/21/2013   PCP:  Pcp, No Pharmacy:   [facility] [address] [place] - [address] [place] [address] Phone: [phone] Fax: [phone]  [facility] [address] [place] [address] Phone: [phone] Fax: [phone]     Social Determinants of Health (SDOH) Social History: SDOH Screenings   Food Insecurity: No Food Insecurity (09/26/2023)  Housing: Low Risk  (09/26/2023)  Transportation Needs: No Transportation Needs (09/26/2023)  Utilities: Not At Risk (09/26/2023)  Tobacco Use: Medium Risk (09/26/2023)   SDOH Interventions:     Readmission Risk Interventions     No data to display

## 2023-09-27 NOTE — Plan of Care (Signed)
  Problem: Education: Goal: Knowledge of General Education information will improve Description: Including pain rating scale, medication(s)/side effects and non-pharmacologic comfort measures Outcome: Progressing   Problem: Health Behavior/Discharge Planning: Goal: Ability to manage health-related needs will improve Outcome: Progressing   Problem: Clinical Measurements: Goal: Ability to maintain clinical measurements within normal limits will improve Outcome: Progressing Goal: Will remain free from infection Outcome: Progressing Goal: Diagnostic test results will improve Outcome: Progressing   Problem: Activity: Goal: Risk for activity intolerance will decrease Outcome: Progressing   Problem: Elimination: Goal: Will not experience complications related to bowel motility Outcome: Progressing Goal: Will not experience complications related to urinary retention Outcome: Progressing   Problem: Pain Management: Goal: General experience of comfort will improve Outcome: Progressing

## 2023-09-27 NOTE — Inpatient Diabetes Management (Signed)
Inpatient Diabetes Program Recommendations  AACE/ADA: New Consensus Statement on Inpatient Glycemic Control (2015)  Target Ranges:  Prepandial:   less than 140 mg/dL      Peak postprandial:   less than 180 mg/dL (1-2 hours)      Critically ill patients:  140 - 180 mg/dL   Lab Results  Component Value Date   GLUCAP 107 (H) 07/17/2020   HGBA1C 7.4 (H) 09/25/2023    Review of Glycemic Control  Diabetes history: New Diabetes Diagnosis  A1c 7.4% this admission  Spoke with patient about new diabetes diagnosis.  Discussed A1C results (7.4% this admission) and explained what an A1C is. Discussed basic pathophysiology of DM Type 2, basic home care, importance of checking CBGs and maintaining good CBG control to prevent long-term and short-term complications. Reviewed glucose and A1C goals. Reviewed signs and symptoms of hyperglycemia and hypoglycemia along with treatment for both. Discussed impact of nutrition, exercise, stress, sickness, and medications on diabetes control. Discussed lifestyle modifications in detail including exercise and dietary changes, beverage options. Informed patient that he will be prescribed Glipizide at time of discharge and to take the medication everyday with a meal. Provided pt a relion meter from [employer] and taught pt how to check CBGs at home and when to perform the test.   Patient verbalized understanding of information discussed and he states that he has no further questions at this time related to diabetes.   RNs to provide ongoing basic DM education at bedside with this patient and engage patient to actively check blood glucose.   Thanks, Christena Deem RN, MSN, BC-ADM Inpatient Diabetes Coordinator Team Pager [phone] (8a-5p)

## 2023-09-27 NOTE — Consult Note (Signed)
Initial Consultation Note   Patient: Michael [name]:[id number] DOB: 10/04/1971 PCP: Pcp, No DOA: 09/25/2023 DOS: the patient was seen and examined on 09/27/2023 Primary service: Montez Morita Md, MD  Referring physician: Manus Rudd, MD Reason for consult: New onset diabetes  Assessment/Plan: Assessment and Plan:  Recurrent perineal abscess Patient underwent I&D in operating room 12/10 after failed bedside attempt. -Augmentin -Per general surgery   New onset diabetes mellitus type 2 Patient was found to have elevated blood sugar 199 and hemoglobin A1c 7.4.  Prior report history of prediabetes and patient has significant family history for diabetes. -Hypoglycemic protocols -Start glipizide 5 mg daily.  Prescription sent to [facility] pharmacy -Diabetic education consulted -TOC consulted due to the lack of insurance and need of primary care provider   Obesity  BMI 37.45 kg/m  TRH will sign off at present, please call us again when needed.  HPI: Michael [name] is a 52 y.o. male with past medical history of hypertension, hyperlipidemia,  prediabetes, and prior perineal abscess s/p I&D who  presented with perineal pain.  Labs initiated on 12/1 noted WBC 12.3, glucose 199, and hemoglobin A1c 7.4.  CT angiogram of the pelvis have been obtained which noted recurrent 4.8 x 3 x 8 cm perianal abscess.  Patient been treated with antibiotics and underwent I&D by general surgery.  Patient reports a family history of diabetes, with mother, sister, and brother affected.  His sister as well as his mother who is now deceased required insulin treatment, while the brother takes oral medications. The patient denies previous use of diabetes medications previously in the past.  Denies any recent fevers, chills, nausea, vomiting, diarrhea.  He expresses that he would like to be on home medicines and not on insulin if possible.  He admits that he does not have a primary care provider and lacks insurance  currently.  Review of Systems: As mentioned in the history of present illness. All other systems reviewed and are negative. Past Medical History:  Diagnosis Date   Hyperlipidemia    no meds, diet   Hypertension    Knee pain    right   Pre-diabetes    no meds, diet   Past Surgical History:  Procedure Laterality Date   HEMORROIDECTOMY     INCISION AND DRAINAGE PERIRECTAL ABSCESS N/A 05/10/2022   Procedure: IRRIGATION AND DEBRIDEMENT PERIRECTAL ABSCESS;  Surgeon: Sheliah Hatch De Blanch, MD;  Location: [facility] OR;  Service: General;  Laterality: N/A;   INCISION AND DRAINAGE PERIRECTAL ABSCESS N/A 09/26/2023   Procedure: IRRIGATION AND DEBRIDEMENT PERINEAL  ABSCESS;  Surgeon: Quentin Ore, MD;  Location: [facility] OR;  Service: General;  Laterality: N/A;   LEG SURGERY     left   Social History:  reports that he quit smoking about 12 years ago. His smoking use included cigarettes. He has never used smokeless tobacco. He reports current alcohol use of about 20.0 standard drinks of alcohol per week. He reports that he does not use drugs.  No Known Allergies  Family History  Problem Relation Age of Onset   Diabetes Mother    Diabetes Father     Prior to Admission medications   Medication Sig Start Date End Date Taking? Authorizing Provider  ibuprofen (ADVIL,MOTRIN) 800 MG tablet Take 1 tablet (800 mg total) by mouth 3 (three) times daily. Patient taking differently: Take 800 mg by mouth every 8 (eight) hours as needed for moderate pain (pain score 4-6). 05/22/18  Yes Wieters, Watseka C, PA-C  Multiple  Vitamins-Minerals (MULTIVITAMIN MEN 50+) TABS Take 1 tablet by mouth daily.   Yes [provider]  amoxicillin (AMOXIL) 875 MG tablet Take 875 mg by mouth 2 (two) times daily. Patient not taking: Reported on 09/26/2023    [provider]    Physical Exam: Vitals:   09/26/23 2215 09/27/23 0132 09/27/23 0411 09/27/23 0845  BP: 121/83 125/88 121/81 113/86  Pulse: 86 95  86   Resp: 16 16 18 20   Temp: 98.4 F (36.9 C) 97.6 F (36.4 C) 98 F (36.7 C) 98.2 F (36.8 C)  TempSrc: Oral Oral Oral   SpO2: 94% 94% 93% 96%  Weight:      Height:        Constitutional: Middle-age male currently in no acute distress Eyes: PERRL, lids and conjunctivae normal ENMT: Mucous membranes are moist.  Fair dentition Neck: normal, supple, no masses, no thyromegaly Respiratory: clear to auscultation bilaterally, no wheezing, no crackles. Normal respiratory effort. No accessory muscle use.  Cardiovascular: Regular rate and rhythm no significant lower extremity swelling. Abdomen: no tenderness, no masses palpated.    Musculoskeletal: no clubbing / cyanosis. No joint deformity upper and lower extremities. Good ROM, no contractures. Normal muscle tone.  Data Reviewed:   reviewed labs, imaging, and pertinent records as documented.   Family Communication: None Primary team communication:  Thank you very much for involving [place] in the care of your patient.  Author: Clydie Braun, MD 09/27/2023 9:44 AM  For on call review [email or web address].

## 2023-09-27 NOTE — Plan of Care (Signed)

## 2023-09-28 ENCOUNTER — Emergency Department (HOSPITAL_COMMUNITY)
Admission: EM | Admit: 2023-09-28 | Discharge: 2023-09-28 | Disposition: A | Discharge status: Home / Self Care | Payer: Self-pay | Attending: Emergency Medicine | Admitting: Emergency Medicine

## 2023-09-28 ENCOUNTER — Other Ambulatory Visit: Payer: Self-pay

## 2023-09-28 ENCOUNTER — Encounter (HOSPITAL_COMMUNITY): Payer: Self-pay

## 2023-09-28 DIAGNOSIS — Z7984 Long term (current) use of oral hypoglycemic drugs: Secondary | ICD-10-CM | POA: Insufficient documentation

## 2023-09-28 DIAGNOSIS — L7682 Other postprocedural complications of skin and subcutaneous tissue: Secondary | ICD-10-CM | POA: Insufficient documentation

## 2023-09-28 DIAGNOSIS — D72829 Elevated white blood cell count, unspecified: Secondary | ICD-10-CM | POA: Insufficient documentation

## 2023-09-28 DIAGNOSIS — I10 Essential (primary) hypertension: Secondary | ICD-10-CM | POA: Insufficient documentation

## 2023-09-28 LAB — CBC WITH DIFFERENTIAL/PLATELET
Abs Immature Granulocytes: 0.08 10*3/uL — ABNORMAL HIGH (ref 0.00–0.07)
Basophils Absolute: 0.1 10*3/uL (ref 0.0–0.1)
Basophils Relative: 0 %
Eosinophils Absolute: 0.2 10*3/uL (ref 0.0–0.5)
Eosinophils Relative: 1 %
HCT: 40.9 % (ref 39.0–52.0)
Hemoglobin: 13.8 g/dL (ref 13.0–17.0)
Immature Granulocytes: 1 %
Lymphocytes Relative: 16 %
Lymphs Abs: 2.8 10*3/uL (ref 0.7–4.0)
MCH: 31.2 pg (ref 26.0–34.0)
MCHC: 33.7 g/dL (ref 30.0–36.0)
MCV: 92.3 fL (ref 80.0–100.0)
Monocytes Absolute: 1 10*3/uL (ref 0.1–1.0)
Monocytes Relative: 6 %
Neutro Abs: 13.5 10*3/uL — ABNORMAL HIGH (ref 1.7–7.7)
Neutrophils Relative %: 76 %
Platelets: 383 10*3/uL (ref 150–400)
RBC: 4.43 MIL/uL (ref 4.22–5.81)
RDW: 11.8 % (ref 11.5–15.5)
WBC: 17.6 10*3/uL — ABNORMAL HIGH (ref 4.0–10.5)
nRBC: 0 % (ref 0.0–0.2)

## 2023-09-28 LAB — COMPREHENSIVE METABOLIC PANEL
ALT: 24 U/L (ref 0–44)
AST: 16 U/L (ref 15–41)
Albumin: 3.1 g/dL — ABNORMAL LOW (ref 3.5–5.0)
Alkaline Phosphatase: 81 U/L (ref 38–126)
Anion gap: 9 (ref 5–15)
BUN: 14 mg/dL (ref 6–20)
CO2: 24 mmol/L (ref 22–32)
Calcium: 8.6 mg/dL — ABNORMAL LOW (ref 8.9–10.3)
Chloride: 105 mmol/L (ref 98–111)
Creatinine, Ser: 0.68 mg/dL (ref 0.61–1.24)
GFR, Estimated: >60 mL/min (ref >60)
Glucose, Bld: 92 mg/dL (ref 70–99)
Potassium: 3.6 mmol/L (ref 3.5–5.1)
Sodium: 138 mmol/L (ref 135–145)
Total Bilirubin: 0.6 mg/dL (ref <1.2)
Total Protein: 6.4 g/dL — ABNORMAL LOW (ref 6.5–8.1)

## 2023-09-28 LAB — I-STAT CG4 LACTIC ACID, ED: Lactic Acid, Venous: 0.8 mmol/L (ref 0.5–1.9)

## 2023-09-28 NOTE — ED Provider Notes (Signed)
[place] DE[facility]MENT AT [facility] Provider Note   CSN: [id number] Arrival date & time: 09/28/23  1226     History  Chief Complaint  Patient presents with   Post-op Problem    JEZIAH LANDGREN is a 52 y.o. male.  Patient is a 52 year old male with a history of hyperlipidemia, hypertension and prediabetes who presents today because of the Penrose drain falling out of his recently I&D perirectal abscess.  He reports that it came out this morning and is only been there a day and a half.  He is still taking his Augmentin denies any fevers or other complaints other than pain in his rectum.  The history is provided by the patient, the spouse and medical records.       Home Medications Prior to Admission medications   Medication Sig Start Date End Date Taking? Authorizing Provider  acetaminophen (TYLENOL) 325 MG tablet Take 2 tablets (650 mg total) by mouth every 6 (six) hours as needed for mild pain (pain score 1-3) or moderate pain (pain score 4-6). 09/27/23  Yes SimaanFrancine Graven, PA-C  amoxicillin-clavulanate (AUGMENTIN) 875-125 MG tablet Take 1 tablet by mouth 2 (two) times daily for 5 days. 09/27/23 10/02/23 Yes Simaan, Francine Graven, PA-C  glipiZIDE (GLUCOTROL) 5 MG tablet Take 1 tablet (5 mg total) by mouth daily before breakfast. 09/28/23  Yes Madelyn Flavors A, MD  ibuprofen (ADVIL,MOTRIN) 800 MG tablet Take 1 tablet (800 mg total) by mouth 3 (three) times daily. Patient taking differently: Take 800 mg by mouth every 8 (eight) hours as needed for moderate pain (pain score 4-6). 05/22/18  Yes Wieters, Hallie C, PA-C  Multiple Vitamins-Minerals (MULTIVITAMIN MEN 50+) TABS Take 1 tablet by mouth daily.   Yes [provider]  oxyCODONE (OXY IR/ROXICODONE) 5 MG immediate release tablet Take 1 tablet (5 mg total) by mouth every 6 (six) hours as needed for severe pain (pain score 7-10) (not releived by tylenol or advil). 09/27/23  Yes Simaan, Francine Graven,  PA-C  polyethylene glycol powder (GLYCOLAX/MIRALAX) 17 GM/SCOOP powder Mix and take 1 capful (17 g) with water by mouth daily as needed for mild constipation. 09/27/23  Yes Adam Phenix, PA-C      Allergies    Patient has no known allergies.    Review of Systems   Review of Systems  Physical Exam Updated Vital Signs BP (!) 133/99 (BP Location: Right Arm)   Pulse 70   Temp (!) 97.5 F (36.4 C) (Oral)   Resp 18   Ht 5\' 6"  (1.676 m)   Wt 105.2 kg   SpO2 95%   BMI 37.45 kg/m  Physical Exam Vitals and nursing note reviewed.  Constitutional:      General: He is not in acute distress.    Appearance: Normal appearance.  HENT:     Head: Normocephalic.  Cardiovascular:     Rate and Rhythm: Normal rate.  Pulmonary:     Effort: Pulmonary effort is normal.  Abdominal:     Tenderness: There is no abdominal tenderness.  Genitourinary:   Neurological:     Mental Status: He is alert. Mental status is at baseline.  Psychiatric:        Mood and Affect: Mood normal.     ED Results / Procedures / Treatments   Labs (all labs ordered are listed, but only abnormal results are displayed) Labs Reviewed  CBC WITH DIFFERENTIAL/PLATELET - Abnormal; Notable for the following components:  Result Value   WBC 17.6 (*)    Neutro Abs 13.5 (*)    Abs Immature Granulocytes 0.08 (*)    All other components within normal limits  COMPREHENSIVE METABOLIC PANEL - Abnormal; Notable for the following components:   Calcium 8.6 (*)    Total Protein 6.4 (*)    Albumin 3.1 (*)    All other components within normal limits  I-STAT CG4 LACTIC ACID, ED  I-STAT CG4 LACTIC ACID, ED    EKG None  Radiology No results found.  Procedures Procedures    Medications Ordered in ED Medications - No data to display  ED Course/ Medical Decision Making/ A&P                                 Medical Decision Making Amount and/or Complexity of Data Reviewed Labs: ordered. Decision-making  details documented in ED Course.   Patient presenting today due to his Penrose drain falling out this morning after only being there a day and a half after an I&D after a perirectal abscess.  On exam patient's drain is attached to the nearby skin but is no longer in the I&D site.  Spoke with Carlena Bjornstad with the general surgery team who wants to come and evaluate the patient for further management options. I independently interpreted patient's labs today which were drawn prior to patient coming back he does have a leukocytosis of 17 but otherwise labs are unchanged from prior.  Most likely from recent I&D.  Patient has been compliant with Augmentin thus far.  On exam no significant findings of progressive cellulitis or necrotizing fasciitis/Fournier's.  2:36 PM Surgery came and saw the patient.  They remove the drain.  They are going to have patient continue sitz bath's and follow-up in the office.        Final Clinical Impression(s) / ED Diagnoses Final diagnoses:  Other postoperative complication of subcutaneous tissue    Rx / DC Orders ED Discharge Orders     None         Gwyneth Sprout, MD 09/28/23 1436

## 2023-09-28 NOTE — ED Triage Notes (Signed)
Pt to ED c/o post op problem, hx perianal abscess, drain placed 2 days ago, reports fell out yesterday.

## 2023-09-28 NOTE — ED Provider Triage Note (Signed)
Emergency Medicine Provider Triage Evaluation Note  LEVINE JAROSH , a 52 y.o. male  was evaluated in triage.  Pt complains of drain in perineum that came out last night after having I+D. Reports increased pain and swelling at that site. States he had a fever this AM. Took pain pill w/tylenol this AM.  Review of Systems  Positive: wound Negative: N/V  Physical Exam  BP (!) 133/99 (BP Location: Right Arm)   Pulse 70   Temp (!) 97.5 F (36.4 C) (Oral)   Resp 18   SpO2 95%  Gen:   Awake, no distress   Resp:  Normal effort  MSK:   Moves extremities without difficulty   Medical Decision Making  Medically screening exam initiated at 12:35 PM.  Appropriate orders placed.  ANCELMO GLUNZ was informed that the remainder of the evaluation will be completed by another provider, this initial triage assessment does not replace that evaluation, and the importance of remaining in the ED until their evaluation is complete.    Pete Pelt, [place] 09/28/23 1237

## 2023-09-28 NOTE — Progress Notes (Signed)
Patient ID: Michael [name], male   DOB: 31-May-1971, 52 y.o.   MRN: [id number] [facility] Surgery Progress Note     Subjective: CC-  Michael [name] was discharged home yesterday after undergoing I&D of a perineal abscess 2 days ago by Dr. Dossie Der. States that the drain fell out this morning. Denies worsening pain in this area. Denies fever. He has been taking augmentin as prescribed.  Objective: Vital signs in last 24 hours: Temp:  [97.5 F (36.4 C)] 97.5 F (36.4 C) (12/12 1231) Pulse Rate:  [70] 70 (12/12 1231) Resp:  [18] 18 (12/12 1231) BP: (133)/(99) 133/99 (12/12 1231) SpO2:  [95 %] 95 % (12/12 1231) Weight:  [105.2 kg] 105.2 kg (12/12 1244)    Intake/Output from previous day: No intake/output data recorded. Intake/Output this shift: No intake/output data recorded.  PE: Gen:  Alert, NAD, pleasant Card:  RRR Pulm:  rate and effort normal GU: perineal wound open without cellulitis or induration. Penrose attached to skin with single suture, attempted to slide penrose drain back into the wound but this would not stay in place so I removed the penrose  Lab Results:  Recent Labs    09/25/23 1843 09/28/23 1247  WBC 12.3* 17.6*  HGB 14.8 13.8  HCT 45.5 40.9  PLT 280 383   BMET Recent Labs    09/25/23 1843 09/28/23 1247  NA 139 138  K 3.8 3.6  CL 106 105  CO2 23 24  GLUCOSE 199* 92  BUN 15 14  CREATININE 0.91 0.68  CALCIUM 8.8* 8.6*   PT/INR No results for input(s): "LABPROT", "INR" in the last 72 hours. CMP     Component Value Date/Time   NA 138 09/28/2023 1247   K 3.6 09/28/2023 1247   CL 105 09/28/2023 1247   CO2 24 09/28/2023 1247   GLUCOSE 92 09/28/2023 1247   BUN 14 09/28/2023 1247   CREATININE 0.68 09/28/2023 1247   CALCIUM 8.6 (L) 09/28/2023 1247   PROT 6.4 (L) 09/28/2023 1247   ALBUMIN 3.1 (L) 09/28/2023 1247   AST 16 09/28/2023 1247   ALT 24 09/28/2023 1247   ALKPHOS 81 09/28/2023 1247   BILITOT 0.6 09/28/2023 1247    GFRNONAA >60 09/28/2023 1247   GFRAA >60 07/07/2020 1324   Lipase     Component Value Date/Time   LIPASE 36 09/25/2023 1843       Studies/Results: No results found.  Anti-infectives: Anti-infectives (From admission, onward)    None        Assessment/Plan  POD#2 s/p IRRIGATION AND DEBRIDEMENT PERINEAL ABSCESS 12/10 Dr. Dossie Der - Penrose drain fell out and patient presented to the ED for evaluation. I attempted to reinsert the penrose but it would not stay in place. The wound is wide open and shows no signs of any undrained abscess, so I removed the penrose drain. Continue augmentin as prescribed. Sitz baths. Discussed OTC medications for constipation. Follow up in the office as scheduled. Advised him to call our office first with any other postoperative issues. Patient may be discharged from ED.    LOS: 0 days    Franne Forts, [facility] Surgery 09/28/2023, 2:33 PM Please see Amion for pager number during day hours 7:00am-4:30pm

## 2023-10-03 LAB — AEROBIC/ANAEROBIC CULTURE W GRAM STAIN (SURGICAL/DEEP WOUND)

## 2023-10-13 ENCOUNTER — Telehealth (INDEPENDENT_AMBULATORY_CARE_PROVIDER_SITE_OTHER): Payer: Self-pay | Admitting: Primary Care

## 2023-10-13 NOTE — Telephone Encounter (Signed)
Calling to confirm apt. Pt will be present.

## 2023-10-16 ENCOUNTER — Telehealth (INDEPENDENT_AMBULATORY_CARE_PROVIDER_SITE_OTHER): Payer: Self-pay

## 2023-10-16 ENCOUNTER — Inpatient Hospital Stay (INDEPENDENT_AMBULATORY_CARE_PROVIDER_SITE_OTHER): Payer: Self-pay | Admitting: Primary Care

## 2023-10-16 NOTE — Telephone Encounter (Signed)
Contacted pt to see if she would like to switch appt to virtual, still come in or reschedule pt stated he would like to reschedule appt   Pt has been reschedule for 10/25/2023 at 330pm

## 2023-10-24 ENCOUNTER — Telehealth (INDEPENDENT_AMBULATORY_CARE_PROVIDER_SITE_OTHER): Payer: Self-pay | Admitting: Primary Care

## 2023-10-24 NOTE — Telephone Encounter (Signed)
 Called to confirm apt. Pt did not answer and could not leave VM.

## 2023-10-25 ENCOUNTER — Ambulatory Visit (INDEPENDENT_AMBULATORY_CARE_PROVIDER_SITE_OTHER): Payer: Self-pay | Admitting: Primary Care

## 2023-10-25 ENCOUNTER — Encounter (INDEPENDENT_AMBULATORY_CARE_PROVIDER_SITE_OTHER): Payer: Self-pay | Admitting: Primary Care

## 2023-10-25 VITALS — BP 139/85 | HR 88 | Resp 16 | Ht 66.0 in | Wt 230.4 lb

## 2023-10-25 DIAGNOSIS — E669 Obesity, unspecified: Secondary | ICD-10-CM

## 2023-10-25 DIAGNOSIS — E66812 Obesity, class 2: Secondary | ICD-10-CM

## 2023-10-25 DIAGNOSIS — Z09 Encounter for follow-up examination after completed treatment for conditions other than malignant neoplasm: Secondary | ICD-10-CM

## 2023-10-25 DIAGNOSIS — Z7689 Persons encountering health services in other specified circumstances: Secondary | ICD-10-CM

## 2023-10-25 DIAGNOSIS — Z6837 Body mass index (BMI) 37.0-37.9, adult: Secondary | ICD-10-CM

## 2023-10-25 DIAGNOSIS — Z7984 Long term (current) use of oral hypoglycemic drugs: Secondary | ICD-10-CM

## 2023-10-25 DIAGNOSIS — E119 Type 2 diabetes mellitus without complications: Secondary | ICD-10-CM

## 2023-10-25 DIAGNOSIS — I10 Essential (primary) hypertension: Secondary | ICD-10-CM

## 2023-10-25 MED ORDER — LISINOPRIL 5 MG PO TABS: 5.0000 mg | ORAL_TABLET | Freq: Every day | ORAL | 1 refills | Status: AC

## 2023-10-25 MED ORDER — GLIPIZIDE 10 MG PO TABS: 10.0000 mg | ORAL_TABLET | Freq: Two times a day (BID) | ORAL | 1 refills | Status: AC

## 2023-10-25 MED ORDER — METFORMIN HCL 500 MG PO TABS: 500.0000 mg | ORAL_TABLET | Freq: Two times a day (BID) | ORAL | 1 refills | Status: AC

## 2023-10-25 NOTE — Patient Instructions (Signed)
 Diabetes mellitus y nutricin, en adultos Diabetes Mellitus and Nutrition, Adult Si sufre de diabetes, o diabetes mellitus, es muy importante tener hbitos alimenticios saludables debido a que sus niveles de Psychologist, counselling sangre (glucosa) se ven afectados en gran medida por lo que come y bebe. [place] alimentos saludables en las cantidades correctas, aproximadamente a la misma hora todos los [place], [place] ayudar a: Chief Operating Officer su glucemia. Disminuir el riesgo de sufrir una enfermedad cardaca. Mejorar la presin arterial. Barista o mantener un peso saludable. Qu puede afectar mi plan de alimentacin? Todas las personas que sufren de diabetes son diferentes y cada una tiene necesidades diferentes en cuanto a un plan de alimentacin. El mdico puede recomendarle que trabaje con un nutricionista para elaborar el mejor plan para usted. Su plan de alimentacin puede variar segn factores como: Las caloras que necesita. Los medicamentos que toma. Su peso. Sus niveles de glucemia, presin arterial y colesterol. Su nivel de [place]. Otras afecciones que tenga, como enfermedades cardacas o renales. Cmo me afectan los carbohidratos? Los carbohidratos, o hidratos de carbono, afectan su nivel de glucemia ms que cualquier otro tipo de alimento. La ingesta de carbohidratos aumenta la cantidad de [employer]. Es importante conocer la cantidad de carbohidratos que se pueden ingerir en cada comida sin correr Surveyor, minerals. Esto es Government social research officer. Su nutricionista puede ayudarlo a calcular la cantidad de carbohidratos que debe ingerir en cada comida y en cada refrigerio. Cmo me afecta el alcohol? El alcohol puede provocar una disminucin de la glucemia (hipoglucemia), especialmente si [place] insulina o toma determinados medicamentos por va oral para la diabetes. La hipoglucemia es una afeccin potencialmente mortal. Los sntomas de la hipoglucemia, como somnolencia, mareos y confusin, son  similares a los sntomas de haber consumido demasiado alcohol. No beba alcohol si: Su mdico le indica no hacerlo. Est embarazada, puede estar embarazada o est tratando de [place]. Si bebe alcohol: Limite la cantidad que bebe a lo siguiente: De 0 a 1 medida por da para las mujeres. De 0 a 2 medidas por da para los hombres. Sepa cunta cantidad de alcohol hay en las bebidas que toma. En los [address], una medida equivale a una botella de cerveza de 12 oz (355 ml), un vaso de vino de 5 oz (148 ml) o un vaso de una bebida alcohlica de alta graduacin de 1 oz (44 ml). Mantngase hidratado bebiendo agua, refrescos dietticos o t helado sin azcar. Tenga en cuenta que los refrescos comunes, los jugos y otras bebidas para mezclar pueden contener Product/process development scientist y se deben contar como carbohidratos. Consejos para seguir Social worker las etiquetas de los alimentos Comience por leer el tamao de la porcin en la etiqueta de Informacin nutricional de los alimentos envasados y las bebidas. La cantidad de caloras, carbohidratos, grasas y otros nutrientes detallados en la etiqueta se basan en una porcin del alimento. Muchos alimentos contienen ms de una porcin por envase. Verifique la cantidad total de gramos (g) de carbohidratos totales en una porcin. Verifique la cantidad de gramos de grasas saturadas y grasas trans en una porcin. Escoja alimentos que no contengan estas grasas o que su contenido de estas sea [place]. Verifique la cantidad de miligramos (mg) de sal (sodio) en una porcin. La [employer] de las personas deben limitar la ingesta de sodio total a menos de 2300 mg Google. Siempre consulte la informacin nutricional de los alimentos etiquetados como "con bajo contenido de grasa" o "sin grasa".  Estos alimentos pueden tener un mayor contenido de International aid/development worker agregada o carbohidratos refinados, y deben evitarse. Hable con su nutricionista para identificar sus objetivos diarios en  cuanto a los nutrientes mencionados en la etiqueta. Al ir de compras Evite comprar alimentos procesados, enlatados o precocidos. Estos alimentos tienden a Counselling psychologist mayor cantidad de [place], sodio y azcar agregada. Compre en la zona exterior de la tienda de comestibles. Esta es la zona donde se encuentran con mayor frecuencia las frutas y las verduras frescas, los cereales a granel, las carnes frescas y los productos lcteos frescos. Al cocinar Use mtodos de coccin a baja temperatura, como hornear, en lugar de mtodos de coccin a alta temperatura, como frer en abundante aceite. Cocine con aceites saludables, como el aceite de [place], canola o [place]. Evite cocinar con manteca, crema o carnes con alto contenido de grasa. Planificacin de las comidas Coma las comidas y los refrigerios regularmente, preferentemente a la misma hora todos [place]. Evite pasar largos perodos de tiempo sin comer. Consuma alimentos ricos en fibra, como frutas frescas, verduras, frijoles y cereales integrales. Consuma entre 4 y 6 onzas (entre 112 y 168 g) de protenas magras por da, como carnes [place], pollo, pescado, huevos o tofu. Una onza (oz) (28 g) de protena magra equivale a: 1 onza (28 g) de carne, pollo o pescado. 1 huevo.  taza (62 g) de tofu. Coma algunos alimentos por da que contengan grasas saludables, como aguacates, frutos secos, semillas y pescado. Qu alimentos debo comer? Nils Pyle Bayas. Manzanas. Naranjas. Duraznos. Damascos. Ciruelas. Uvas. Mangos. Papayas. Granadas. Kiwi. Cerezas. Verduras Verduras de [employer], que incluyen [place], [place], col rizada, acelga, hojas de berza, hojas de mostaza y repollo. Remolachas. Coliflor. Brcoli. Zanahorias. Judas verdes. Tomates. Pimientos. Cebollas. Pepinos. Coles de Bruselas. Granos Granos integrales, como panes, galletas, tortillas, cereales y pastas de salvado o integrales. Avena sin azcar. Quinua. Arroz integral o salvaje. Carnes y otras  protenas Frutos de mar. Carne de ave sin piel. Cortes magros de ave y carne de res. Tofu. Frutos secos. Semillas. Lcteos Productos lcteos sin grasa o con bajo contenido de [place], [place], yogur y [place]. Es posible que los productos detallados arriba no constituyan una lista completa de los alimentos y las bebidas que puede tomar. Consulte a un nutricionista para obtener ms informacin. Qu alimentos debo evitar? Nils Pyle Frutas enlatadas al almbar. Verduras Verduras enlatadas. Verduras congeladas con mantequilla o salsa de crema. Granos Productos elaborados con [place] y [place], como panes, pastas, bocadillos y cereales. Evite todos los alimentos procesados. Carnes y [address] de carne con alto contenido de Holiday representative. Carne de ave con piel. Carnes empanizadas o fritas. Carne procesada. Evite las grasas saturadas. Lcteos Yogur, queso o [employer]. Bebidas Bebidas azucaradas, como gaseosas o t helado. Es posible que los productos que se enumeran ms [place] no constituyan una lista completa de los alimentos y las bebidas que Personnel officer. Consulte a un nutricionista para obtener ms informacin. Preguntas para hacerle al mdico Debo consultar con un especialista certificado en atencin y educacin sobre la diabetes? Es necesario que me rena con un nutricionista? A qu nmero puedo llamar si tengo preguntas? Cules son los mejores momentos para controlar la glucemia? Dnde encontrar ms informacin: American Diabetes Association (Asociacin Estadounidense de la Diabetes): diabetes.org Academy of Nutrition and Dietetics (Academia de Nutricin y Pension scheme manager): eatright.[employer] of Diabetes and Digestive and Kidney Diseases [employer] de la Diabetes y las Enfermedades Digestivas y Renales): StageSync.si Association of Diabetes  Care & Education Specialists (Asociacin de Especialistas en Atencin y Francella Solian la Diabetes):  diabeteseducator.org Resumen Es importante tener hbitos alimenticios saludables debido a que sus niveles de Psychologist, counselling sangre (glucosa) se ven afectados en gran medida por lo que come y bebe. Es importante consumir alcohol con prudencia. Un plan de comidas saludable lo ayudar a controlar la glucosa en sangre y a reducir el riesgo de enfermedades cardacas. El mdico puede recomendarle que trabaje con un nutricionista para elaborar el mejor plan para usted. Esta informacin no tiene Theme park manager el consejo del mdico. Asegrese de hacerle al mdico cualquier pregunta que tenga. Document Revised: 06/10/2020 Document Reviewed: 06/10/2020 Elsevier Patient Education  2024 [employer].

## 2023-10-25 NOTE — Progress Notes (Signed)
 Subjective:   Michael [name] is a 53 y.o. male presents for ED follow up and establish care. Patient presented to ED on 09/28/23 due to a  Penrose drain falling out of his recently I&D perirectal abscess. He is a diabetic- Denies  polydipsia or vision changes.  Endorses polyuria and polyphagia. Patient has HTN has No headache, No chest pain, No abdominal pain - No Nausea, No new weakness tingling or numbness, No Cough - shortness of breath   Past Medical History:  Diagnosis Date   Hyperlipidemia    no meds, diet   Hypertension    Knee pain    right   Pre-diabetes    no meds, diet     No Known Allergies  Current Outpatient Medications on File Prior to Visit  Medication Sig Dispense Refill   acetaminophen  (TYLENOL ) 325 MG tablet Take 2 tablets (650 mg total) by mouth every 6 (six) hours as needed for mild pain (pain score 1-3) or moderate pain (pain score 4-6). 30 tablet 0   ibuprofen  (ADVIL ,MOTRIN ) 800 MG tablet Take 1 tablet (800 mg total) by mouth 3 (three) times daily. (Patient taking differently: Take 800 mg by mouth every 8 (eight) hours as needed for moderate pain (pain score 4-6).) 30 tablet 0   Multiple Vitamins-Minerals (MULTIVITAMIN MEN 50+) TABS Take 1 tablet by mouth daily.     polyethylene glycol powder (GLYCOLAX /MIRALAX ) 17 GM/SCOOP powder Mix and take 1 capful (17 g) with water by mouth daily as needed for mild constipation. 238 g 0   No current facility-administered medications on file prior to visit.    Review of System:  Comprehensive ROS Pertinent positive and negative noted in HPI   Objective:  BP 139/85 (BP Location: Right Arm, Patient Position: Sitting, Cuff Size: Normal)   Pulse 88   Resp 16   Ht 5' 6 (1.676 m)   Wt 230 lb 6.4 oz (104.5 kg)   SpO2 95%   BMI 37.19 kg/m    Physical Exam  General: No apparent distress. Eyes: Extraocular eye movements intact, pupils equal and round. Neck: Supple, trachea midline. Thyroid: No enlargement,  mobile without fixation, no tenderness. Cardiovascular: Regular rhythm and rate, no murmur, normal radial pulses. Respiratory: Normal respiratory effort, clear to auscultation. Gastrointestinal: Normal pitch active bowel sounds, nontender abdomen without distention or appreciable hepatomegaly. Musculoskeletal: Normal muscle tone, no tenderness on palpation of tibia, no excessive thoracic kyphosis. Skin: Appropriate warmth, no visible rash. Mental status: Alert, conversant, speech clear, thought logical, appropriate mood and affect, no hallucinations or delusions evident. Hematologic/lymphatic: No cervical adenopathy, no visible ecchymoses.  Assessment:  Michael [name] was seen today for hospitalization follow-up.  Diagnoses and all orders for this visit:  Essential hypertension BP goal - < 130/80 Explained that having normal blood pressure is the goal and medications are helping to get to goal and maintain normal blood pressure. DIET: Limit salt intake, read nutrition labels to check salt content, limit fried and high fatty foods  Avoid using multisymptom OTC cold preparations that generally contain sudafed which can rise BP. Consult with pharmacist on best cold relief products to use for persons with HTN EXERCISE Discussed incorporating exercise such as walking - 30 minutes most days of the week and can do in 10 minute intervals    -     lisinopril  (ZESTRIL ) 5 MG tablet; Take 1 tablet (5 mg total) by mouth daily.  Encounter to establish care 2/2 Hospital discharge follow-up  See HPI  New onset type  2 diabetes mellitus (HCC) Diabetes education provided . - educated on lifestyle modifications, including but not limited to diet choices and adding exercise to daily routine.    Obesity (BMI 30-39.9) Obesity is 30-39 indicating an excess in caloric intake or underlining conditions. This may lead to other co-morbidities. Educated on lifestyle modifications of diet and exercise which may reduce  obesity.    Other orders -     glipiZIDE  (GLUCOTROL ) 10 MG tablet; Take 1 tablet (10 mg total) by mouth 2 (two) times daily before a meal. -     metFORMIN  (GLUCOPHAGE ) 500 MG tablet; Take 1 tablet (500 mg total) by mouth 2 (two) times daily with a meal.       This note has been created with Education officer, environmental. Any transcriptional errors are unintentional.   Return in about 2 months (around 12/23/2023) for fasting labs, re-check blood pressure.  Michael SHAUNNA Bohr, NP 10/29/2023, 1:48 PM

## 2023-12-25 ENCOUNTER — Ambulatory Visit (INDEPENDENT_AMBULATORY_CARE_PROVIDER_SITE_OTHER): Payer: Self-pay

## 2024-03-27 ENCOUNTER — Ambulatory Visit (HOSPITAL_COMMUNITY)
